# Patient Record
Sex: Female | Born: 1980
Health system: Southern US, Community
[De-identification: ages and names within clinical notes are randomized; demographics above are authoritative.]

## PROBLEM LIST (undated history)

## (undated) ENCOUNTER — Inpatient Hospital Stay (HOSPITAL_COMMUNITY): Payer: Self-pay

## (undated) DIAGNOSIS — J189 Pneumonia, unspecified organism: Secondary | ICD-10-CM

## (undated) DIAGNOSIS — Z5189 Encounter for other specified aftercare: Secondary | ICD-10-CM

## (undated) DIAGNOSIS — O24419 Gestational diabetes mellitus in pregnancy, unspecified control: Secondary | ICD-10-CM

## (undated) HISTORY — PX: OTHER SURGICAL HISTORY: SHX169

## (undated) HISTORY — PX: TONSILLECTOMY: SUR1361

## (undated) HISTORY — PX: TONSILECTOMY, ADENOIDECTOMY, BILATERAL MYRINGOTOMY AND TUBES: SHX2538

## (undated) HISTORY — PX: ABDOMINAL SURGERY: SHX537

---

## 2007-09-15 ENCOUNTER — Emergency Department (HOSPITAL_COMMUNITY): Admission: EM | Admit: 2007-09-15 | Discharge: 2007-09-15 | Payer: Self-pay | Admitting: Emergency Medicine

## 2007-10-24 ENCOUNTER — Emergency Department (HOSPITAL_COMMUNITY): Admission: EM | Admit: 2007-10-24 | Discharge: 2007-10-24 | Payer: Self-pay | Admitting: *Deleted

## 2009-05-01 ENCOUNTER — Other Ambulatory Visit: Admission: RE | Admit: 2009-05-01 | Discharge: 2009-05-01 | Payer: Self-pay | Admitting: Family Medicine

## 2011-06-27 LAB — WET PREP, GENITAL: Trich, Wet Prep: NONE SEEN

## 2011-06-27 LAB — CBC
HCT: 44.4
Platelets: 329
RBC: 4.73

## 2011-06-27 LAB — RPR: RPR Ser Ql: NONREACTIVE

## 2011-06-27 LAB — GC/CHLAMYDIA PROBE AMP, GENITAL
Chlamydia, DNA Probe: NEGATIVE
GC Probe Amp, Genital: NEGATIVE

## 2011-06-27 LAB — DIFFERENTIAL
Basophils Absolute: 0.1
Basophils Relative: 1
Eosinophils Absolute: 0.1
Monocytes Absolute: 0.6
Monocytes Relative: 5
Neutrophils Relative %: 71

## 2011-06-27 LAB — URINALYSIS, ROUTINE W REFLEX MICROSCOPIC
Bilirubin Urine: NEGATIVE
Ketones, ur: NEGATIVE
Specific Gravity, Urine: 1.019
Urobilinogen, UA: 0.2
pH: 7.5

## 2011-06-27 LAB — BASIC METABOLIC PANEL
Creatinine, Ser: 0.8
Sodium: 138

## 2011-06-27 LAB — POCT PREGNANCY, URINE: Operator id: 299071

## 2011-07-15 LAB — URINALYSIS, ROUTINE W REFLEX MICROSCOPIC
Bilirubin Urine: NEGATIVE
Hgb urine dipstick: NEGATIVE
Leukocytes, UA: NEGATIVE
Protein, ur: 100 — AB
pH: 5.5

## 2011-07-15 LAB — DIFFERENTIAL
Basophils Absolute: 0
Lymphocytes Relative: 2 — ABNORMAL LOW
Lymphs Abs: 0.4 — ABNORMAL LOW
Monocytes Absolute: 0.2
Monocytes Relative: 1 — ABNORMAL LOW
Neutrophils Relative %: 97 — ABNORMAL HIGH

## 2011-07-15 LAB — BASIC METABOLIC PANEL
BUN: 11
CO2: 21
Creatinine, Ser: 0.7
GFR calc Af Amer: >60
GFR calc non Af Amer: >60

## 2011-07-15 LAB — CBC
Hemoglobin: 15.1 — ABNORMAL HIGH
RBC: 4.67
RDW: 13.2

## 2011-10-08 NOTE — L&D Delivery Note (Signed)
Delivery Note  SVD viable female Apgars not given by NICU team over 2nd degree ML lac.  Placenta delivered spontaneously intact with 3VC. Repair with 2-0 Chromic with good support and hemostasis noted after 1% lidocaine infiltration and R/V exam confirms.  PH art was 7.24.  [place] cord blood was not done.  Mother and baby were doing well to NICU with team  EBL 300cc  Candice Camp, MD

## 2012-08-26 ENCOUNTER — Encounter (HOSPITAL_COMMUNITY): Payer: Self-pay | Admitting: *Deleted

## 2012-08-26 ENCOUNTER — Inpatient Hospital Stay (HOSPITAL_COMMUNITY)
Admission: AD | Admit: 2012-08-26 | Discharge: 2012-08-28 | DRG: 775 | Disposition: A | Discharge status: Home / Self Care | Payer: Medicaid Other | Source: Ambulatory Visit | Attending: Obstetrics and Gynecology | Admitting: Obstetrics and Gynecology

## 2012-08-26 LAB — RPR: RPR Ser Ql: NONREACTIVE

## 2012-08-26 LAB — CBC
Hemoglobin: 13.1 g/dL (ref 12.0–15.0)
MCHC: 33.5 g/dL (ref 30.0–36.0)
WBC: 33.3 10*3/uL — ABNORMAL HIGH (ref 4.0–10.5)

## 2012-08-26 MED ORDER — OXYCODONE-ACETAMINOPHEN 5-325 MG PO TABS: 1.0000 | ORAL_TABLET | ORAL | Status: DC | PRN

## 2012-08-26 MED ORDER — BUTORPHANOL TARTRATE 1 MG/ML IJ SOLN: 1.0000 mg | INTRAMUSCULAR | Status: DC | PRN

## 2012-08-26 MED ORDER — LACTATED RINGERS IV SOLN: 500.0000 mL | INTRAVENOUS | Status: DC | PRN

## 2012-08-26 MED ORDER — PRENATAL MULTIVITAMIN CH
1.0000 | ORAL_TABLET | Freq: Every day | ORAL | Status: DC
  Administered 2012-08-26: 1 via ORAL
  Filled 2012-08-26: qty 1

## 2012-08-26 MED ORDER — IBUPROFEN 600 MG PO TABS
600.0000 mg | ORAL_TABLET | Freq: Four times a day (QID) | ORAL | Status: DC | PRN
  Administered 2012-08-26: 600 mg via ORAL
  Filled 2012-08-26: qty 1

## 2012-08-26 MED ORDER — CITRIC ACID-SODIUM CITRATE 334-500 MG/5ML PO SOLN: 30.0000 mL | ORAL | Status: DC | PRN

## 2012-08-26 MED ORDER — LIDOCAINE HCL (PF) 1 % IJ SOLN
INTRAMUSCULAR | Status: AC
  Administered 2012-08-26: 30 mL
  Filled 2012-08-26: qty 30

## 2012-08-26 MED ORDER — MEASLES, MUMPS & RUBELLA VAC ~~LOC~~ INJ
0.5000 mL | INJECTION | Freq: Once | SUBCUTANEOUS | Status: DC
  Filled 2012-08-26: qty 0.5

## 2012-08-26 MED ORDER — TETANUS-DIPHTH-ACELL PERTUSSIS 5-2.5-18.5 LF-MCG/0.5 IM SUSP: 0.5000 mL | Freq: Once | INTRAMUSCULAR | Status: DC

## 2012-08-26 MED ORDER — IBUPROFEN 600 MG PO TABS
600.0000 mg | ORAL_TABLET | Freq: Four times a day (QID) | ORAL | Status: DC
  Administered 2012-08-26 – 2012-08-28 (×9): 600 mg via ORAL
  Filled 2012-08-26 (×9): qty 1

## 2012-08-26 MED ORDER — LANOLIN HYDROUS EX OINT: TOPICAL_OINTMENT | CUTANEOUS | Status: DC | PRN

## 2012-08-26 MED ORDER — SENNOSIDES-DOCUSATE SODIUM 8.6-50 MG PO TABS
2.0000 | ORAL_TABLET | Freq: Every day | ORAL | Status: DC
  Administered 2012-08-26 – 2012-08-27 (×2): 2 via ORAL

## 2012-08-26 MED ORDER — ONDANSETRON HCL 4 MG PO TABS: 4.0000 mg | ORAL_TABLET | ORAL | Status: DC | PRN

## 2012-08-26 MED ORDER — FLEET ENEMA 7-19 GM/118ML RE ENEM: 1.0000 | ENEMA | RECTAL | Status: DC | PRN

## 2012-08-26 MED ORDER — MEDROXYPROGESTERONE ACETATE 150 MG/ML IM SUSP: 150.0000 mg | INTRAMUSCULAR | Status: DC | PRN

## 2012-08-26 MED ORDER — SIMETHICONE 80 MG PO CHEW: 80.0000 mg | CHEWABLE_TABLET | ORAL | Status: DC | PRN

## 2012-08-26 MED ORDER — LIDOCAINE HCL (PF) 1 % IJ SOLN: 30.0000 mL | INTRAMUSCULAR | Status: DC | PRN

## 2012-08-26 MED ORDER — OXYTOCIN BOLUS FROM INFUSION: 500.0000 mL | INTRAVENOUS | Status: DC

## 2012-08-26 MED ORDER — OXYTOCIN 40 UNITS IN LACTATED RINGERS INFUSION - SIMPLE MED
INTRAVENOUS | Status: AC
  Administered 2012-08-26: 40 [IU]
  Filled 2012-08-26: qty 1000

## 2012-08-26 MED ORDER — ZOLPIDEM TARTRATE 5 MG PO TABS: 5.0000 mg | ORAL_TABLET | Freq: Every evening | ORAL | Status: DC | PRN

## 2012-08-26 MED ORDER — OXYTOCIN 40 UNITS IN LACTATED RINGERS INFUSION - SIMPLE MED: 62.5000 mL/h | INTRAVENOUS | Status: DC

## 2012-08-26 MED ORDER — ACETAMINOPHEN 325 MG PO TABS: 650.0000 mg | ORAL_TABLET | ORAL | Status: DC | PRN

## 2012-08-26 MED ORDER — WITCH HAZEL-GLYCERIN EX PADS: 1.0000 "application " | MEDICATED_PAD | CUTANEOUS | Status: DC | PRN

## 2012-08-26 MED ORDER — BENZOCAINE-MENTHOL 20-0.5 % EX AERO
1.0000 "application " | INHALATION_SPRAY | CUTANEOUS | Status: DC | PRN
  Administered 2012-08-26: 1 via TOPICAL
  Filled 2012-08-26: qty 56

## 2012-08-26 MED ORDER — ONDANSETRON HCL 4 MG/2ML IJ SOLN: 4.0000 mg | INTRAMUSCULAR | Status: DC | PRN

## 2012-08-26 MED ORDER — ONDANSETRON HCL 4 MG/2ML IJ SOLN: 4.0000 mg | Freq: Four times a day (QID) | INTRAMUSCULAR | Status: DC | PRN

## 2012-08-26 MED ORDER — LACTATED RINGERS IV SOLN: INTRAVENOUS | Status: DC

## 2012-08-26 MED ORDER — DIPHENHYDRAMINE HCL 25 MG PO CAPS: 25.0000 mg | ORAL_CAPSULE | Freq: Four times a day (QID) | ORAL | Status: DC | PRN

## 2012-08-26 MED ORDER — DIBUCAINE 1 % RE OINT: 1.0000 "application " | TOPICAL_OINTMENT | RECTAL | Status: DC | PRN

## 2012-08-26 NOTE — H&P (Addendum)
Adriana Jarvis is a 31 y.o. female presenting for evaluation of contractions and vaginal spotting.  Pregnancy uncomplicated [place].  Was seen in the office 2 days ago and cervix was closed and long.  Had painful ctxs this am and presented to triage. History OB History    Grav Para Term Preterm Abortions TAB SAB Ect Mult Living   1 1  1      1      No past medical history on file. No past surgical history on file. Family History: family history is not on file. Social History:  does not have a smoking history on file. She does not have any smokeless tobacco history on file. Her alcohol and drug histories not on file.   Prenatal Transfer Tool  Maternal Diabetes: No Genetic Screening: Normal Maternal Ultrasounds/Referrals: Normal Fetal Ultrasounds or other Referrals:  None Maternal Substance Abuse:  No Significant Maternal Medications:  None Significant Maternal Lab Results:  None Other Comments:  None  ROS  Dilation: 10 Effacement (%): 100 Station:  (BBOW) Exam by:: L. Munford RN Blood pressure 142/89, pulse 99, resp. rate 22, height 5\' 3"  (1.6 m), weight 75.751 kg (167 lb), SpO2 99.00%, unknown if currently breastfeeding. Exam Physical Exam  Prenatal labs: ABO, Rh:   Antibody:   Rubella:   RPR:    HBsAg:    HIV:    GBS:     Assessment/Plan: IUP at 31 3/7 weeks Now complete.  Anticipate SVD.  NICU alerted. Betamethasone and Magnesium neuorprophylaxis not given because of immanent delivery   Damiyah Ditmars C 08/26/2012, 6:54 AM

## 2012-08-26 NOTE — MAU Note (Signed)
Vaginal bleeding since Monday. Went to the office and was closed and thick. Heavier bleeding and abdominal pain started around 1am this morning.

## 2012-08-27 ENCOUNTER — Encounter (HOSPITAL_COMMUNITY): Payer: Self-pay | Admitting: *Deleted

## 2012-08-27 LAB — CBC
HCT: 35 % — ABNORMAL LOW (ref 36.0–46.0)
Platelets: 233 10*3/uL (ref 150–400)
RDW: 14.2 % (ref 11.5–15.5)
WBC: 23.1 10*3/uL — ABNORMAL HIGH (ref 4.0–10.5)

## 2012-08-27 NOTE — Progress Notes (Signed)
Post Partum Day 1 Subjective: no complaints, up ad lib, voiding, tolerating PO, + flatus and baby stable in NICU off O2  Objective: Blood pressure 120/77, pulse 77, temperature 97.3 F (36.3 C), temperature source Oral, resp. rate 18, height 5\' 3"  (1.6 m), weight 75.751 kg (167 lb), SpO2 100.00%, unknown if currently breastfeeding.  Physical Exam:  General: alert and cooperative Lochia: appropriate Uterine Fundus: firm Incision: perineum intact DVT Evaluation: No evidence of DVT seen on physical exam. No significant calf/ankle edema.   Basename 08/27/12 0550 08/26/12 0620  HGB 11.4* 13.1  HCT 35.0* 39.1    Assessment/Plan: Plan for discharge tomorrow   LOS: 1 day   Adriana Jarvis 08/27/2012, 8:08 AM

## 2012-08-27 NOTE — Progress Notes (Signed)
UR done. 

## 2012-08-28 NOTE — Discharge Summary (Signed)
Obstetric Discharge Summary Reason for Admission: onset of labor Prenatal Procedures: none Intrapartum Procedures: spontaneous vaginal delivery Postpartum Procedures: none Complications-Operative and Postpartum: first degree perineal laceration Hemoglobin  Date Value Range Status  08/27/2012 11.4* 12.0 - 15.0 g/dL Final     HCT  Date Value Range Status  08/27/2012 35.0* 36.0 - 46.0 % Final    Physical Exam:  General: alert Lochia: appropriate Uterine Fundus: firm Incision: na DVT Evaluation: No evidence of DVT seen on physical exam.  Discharge Diagnoses: preterm vaginal delivery at 31.3  Discharge Information: Date: 08/28/2012 Activity: pelvic rest Diet: routine Medications: PNV and Ibuprofen Condition: stable Instructions: refer to practice specific booklet Discharge to: home   Newborn Data: Live born female  Birth Weight: 4 lb 4.1 oz (1930 g) APGAR: 8, 9  Home with NICU.  Tima Curet S 08/28/2012, 8:19 AM

## 2012-08-28 NOTE — Progress Notes (Signed)
Post Partum Day two Subjective: no complaints  Objective: Blood pressure 110/47, pulse 76, temperature 97.9 F (36.6 C), temperature source Oral, resp. rate 18, height 5\' 3"  (1.6 m), weight 75.751 kg (167 lb), SpO2 98.00%, unknown if currently breastfeeding.  Physical Exam:  General: alert Lochia: appropriate Uterine Fundus: firm Incision: na DVT Evaluation: No evidence of DVT seen on physical exam.   Basename 08/27/12 0550 08/26/12 0620  HGB 11.4* 13.1  HCT 35.0* 39.1    Assessment/Plan: Discharge home   LOS: 2 days   Jadwiga Faidley S 08/28/2012, 8:18 AM

## 2012-09-23 ENCOUNTER — Other Ambulatory Visit: Payer: Self-pay | Admitting: Obstetrics and Gynecology

## 2013-10-11 ENCOUNTER — Other Ambulatory Visit: Payer: Self-pay | Admitting: Obstetrics and Gynecology

## 2013-10-12 ENCOUNTER — Other Ambulatory Visit: Payer: Self-pay | Admitting: Obstetrics and Gynecology

## 2013-10-12 DIAGNOSIS — N6325 Unspecified lump in the left breast, overlapping quadrants: Secondary | ICD-10-CM

## 2013-10-12 DIAGNOSIS — N6320 Unspecified lump in the left breast, unspecified quadrant: Principal | ICD-10-CM

## 2013-10-15 ENCOUNTER — Ambulatory Visit: Payer: Medicaid Other

## 2013-10-15 ENCOUNTER — Other Ambulatory Visit: Payer: Medicaid Other

## 2013-10-21 ENCOUNTER — Ambulatory Visit
Admission: RE | Admit: 2013-10-21 | Discharge: 2013-10-21 | Disposition: A | Discharge status: Home / Self Care | Payer: Medicaid Other | Source: Ambulatory Visit | Attending: Obstetrics and Gynecology | Admitting: Obstetrics and Gynecology

## 2013-10-21 DIAGNOSIS — N6320 Unspecified lump in the left breast, unspecified quadrant: Principal | ICD-10-CM

## 2013-10-21 DIAGNOSIS — N6325 Unspecified lump in the left breast, overlapping quadrants: Secondary | ICD-10-CM

## 2014-08-08 ENCOUNTER — Encounter (HOSPITAL_COMMUNITY): Payer: Self-pay | Admitting: *Deleted

## 2015-09-18 LAB — OB RESULTS CONSOLE HIV ANTIBODY (ROUTINE TESTING): HIV: NONREACTIVE

## 2015-09-18 LAB — OB RESULTS CONSOLE GC/CHLAMYDIA
CHLAMYDIA, DNA PROBE: NEGATIVE
GC PROBE AMP, GENITAL: NEGATIVE

## 2015-09-18 LAB — OB RESULTS CONSOLE RUBELLA ANTIBODY, IGM: RUBELLA: IMMUNE

## 2015-09-18 LAB — OB RESULTS CONSOLE HEPATITIS B SURFACE ANTIGEN: HEP B S AG: NEGATIVE

## 2015-10-08 NOTE — L&D Delivery Note (Signed)
Delivery Note At 10:43 PM a viable female "Adriana Jarvis" was delivered via Vaginal, Spontaneous Delivery (Presentation: OA restituting to Right Occiput Anterior).  APGARS: 8, 10; weight 7 lb 9.9 oz (3456 g).   Placenta status: Intact, Spontaneous [place]. Cord: 3 vessels with the following complications: None.  Cord pH: NA  Anesthesia: Epidural Episiotomy: None Lacerations: 2nd degree;Perineal Suture Repair: 3.0 chromic CT and SH Est. Blood Loss (mL): 350  Mom to postpartum.  Baby to Couplet care / Skin to Skin.  Mom plans to breastfeed.  Couple plans vasectomy for contraception.  Desires inpatient circumcision.  Sherre ScarletWILLIAMS, Adriana Abernethy 04/22/2016, 12:38 AM

## 2016-02-14 LAB — OB RESULTS CONSOLE RPR: RPR: NONREACTIVE

## 2016-03-11 ENCOUNTER — Encounter: Payer: 59 | Attending: Obstetrics and Gynecology | Admitting: Skilled Nursing Facility1

## 2016-03-11 VITALS — Ht 64.0 in

## 2016-03-11 DIAGNOSIS — O24419 Gestational diabetes mellitus in pregnancy, unspecified control: Secondary | ICD-10-CM | POA: Diagnosis present

## 2016-03-11 DIAGNOSIS — O24410 Gestational diabetes mellitus in pregnancy, diet controlled: Secondary | ICD-10-CM

## 2016-03-12 ENCOUNTER — Encounter: Payer: Self-pay | Admitting: Skilled Nursing Facility1

## 2016-03-12 NOTE — Progress Notes (Signed)
  Patient was seen on 03/12/2016 for Gestational Diabetes self-management class at the [facility]. The following learning objectives were met by the patient during this course:   States the definition of Gestational Diabetes  States why dietary management is important in controlling blood glucose  Describes the effects each nutrient has on blood glucose levels  Demonstrates ability to create a balanced meal plan  Demonstrates carbohydrate counting   States when to check blood glucose levels involving a total of 4 separate occurences in a day  Demonstrates proper blood glucose monitoring techniques  States the effect of stress and exercise on blood glucose levels  States the importance of limiting caffeine and abstaining from alcohol and smoking  Demonstrates the knowledge the glucometer provided in class may not be covered by their insurance and to call their insurance provider immediately after class to know which glucometer their insurance provider does cover as well as calling their physician the next day for a prescription to the glucometer their insurance does cover (if the one provided is not) as well as the lancets and strips for that meter.  Blood glucose monitor given: contour next one Lot # [id number] Exp: 12/04/2016 Blood glucose reading: Not taken due to unavailable strips  Patient instructed to monitor glucose levels: FBS: 60 - <90 1 hour: <140 2 hour: <120  *Patient received handouts:  Nutrition Diabetes and Pregnancy  Carbohydrate Counting List  Patient will be seen for follow-up as needed.

## 2016-03-20 ENCOUNTER — Inpatient Hospital Stay (HOSPITAL_COMMUNITY)
Admission: AD | Admit: 2016-03-20 | Discharge: 2016-03-20 | Disposition: A | Discharge status: Home / Self Care | Payer: 59 | Source: Ambulatory Visit | Attending: Obstetrics and Gynecology | Admitting: Obstetrics and Gynecology

## 2016-03-20 ENCOUNTER — Encounter (HOSPITAL_COMMUNITY): Payer: Self-pay | Admitting: *Deleted

## 2016-03-20 DIAGNOSIS — Z3A32 32 weeks gestation of pregnancy: Secondary | ICD-10-CM | POA: Insufficient documentation

## 2016-03-20 DIAGNOSIS — O4703 False labor before 37 completed weeks of gestation, third trimester: Secondary | ICD-10-CM | POA: Insufficient documentation

## 2016-03-20 HISTORY — DX: Pneumonia, unspecified organism: J18.9

## 2016-03-20 HISTORY — DX: Gestational diabetes mellitus in pregnancy, unspecified control: O24.419

## 2016-03-20 HISTORY — DX: Encounter for other specified aftercare: Z51.89

## 2016-03-20 LAB — URINALYSIS, ROUTINE W REFLEX MICROSCOPIC
BILIRUBIN URINE: NEGATIVE
Glucose, UA: NEGATIVE mg/dL
Hgb urine dipstick: NEGATIVE
Ketones, ur: 15 mg/dL — AB
NITRITE: NEGATIVE
Protein, ur: NEGATIVE mg/dL
pH: 6 (ref 5.0–8.0)

## 2016-03-20 LAB — FETAL FIBRONECTIN: FETAL FIBRONECTIN: NEGATIVE

## 2016-03-20 LAB — URINE MICROSCOPIC-ADD ON

## 2016-03-20 MED ORDER — NIFEDIPINE 10 MG PO CAPS: 10.0000 mg | ORAL_CAPSULE | Freq: Four times a day (QID) | ORAL | Status: DC | PRN

## 2016-03-20 MED ORDER — LACTATED RINGERS IV BOLUS (SEPSIS)
1000.0000 mL | Freq: Once | INTRAVENOUS | Status: AC
  Administered 2016-03-20: 1000 mL via INTRAVENOUS

## 2016-03-20 MED ORDER — NIFEDIPINE 10 MG PO CAPS
10.0000 mg | ORAL_CAPSULE | ORAL | Status: AC | PRN
  Administered 2016-03-20 (×3): 10 mg via ORAL
  Filled 2016-03-20 (×3): qty 1

## 2016-03-20 NOTE — Discharge Instructions (Signed)

## 2016-03-20 NOTE — MAU Provider Note (Signed)
History     CSN: [id number]  Arrival date and time: 03/20/16 1206   First Provider Initiated Contact with Patient 03/20/16 1333       Chief Complaint  Patient presents with  . preterm contractions    HPI Adriana Jarvis is a 35 y.o. G2P0101 at 3740w5d who presents with contractions. PMH significant for PTD at 31 wks with previous pregnancy. Pt gets weekly 17p injections -- due today. Reports contractions since last night -- increased in frequency this morning, every 6-8 minutes. Rates pain 2/10. Denies LOF or vaginal bleeding. Denies recent intercourse. Positive fetal movement.   OB History    Gravida Para Term Preterm AB TAB SAB Ectopic Multiple Living   2 1  1      1       Past Medical History  Diagnosis Date  . Blood transfusion without reported diagnosis   . Gestational diabetes   . Pneumonia   . Preterm labor     Past Surgical History  Procedure Laterality Date  . Tonsilectomy, adenoidectomy, bilateral myringotomy and tubes    . Tonsillectomy      Family History  Problem Relation Age of Onset  . Cancer Other   . Hyperlipidemia Other     Social History  Substance Use Topics  . Smoking status: Former Smoker    Types: Cigarettes    Quit date: 06/08/2015  . Smokeless tobacco: None  . Alcohol Use: No    Allergies:  Allergies  Allergen Reactions  . Phenergan [Promethazine Hcl] Anaphylaxis    Tongue swelling, throat closes  . Amoxicillin Hives    Has patient had a PCN reaction causing immediate rash, facial/tongue/throat swelling, SOB or lightheadedness with hypotension: Yes Has patient had a PCN reaction causing severe rash involving mucus membranes or skin necrosis: No Has patient had a PCN reaction that required hospitalization No Has patient had a PCN reaction occurring within the last 10 years: No If all of the above answers are "NO", then may proceed with Cephalosporin use.     Prescriptions prior to admission  Medication Sig Dispense Refill Last  Dose  . hydroxyprogesterone caproate (MAKENA) 250 mg/mL OIL injection Inject 250 mg into the muscle once a week.   03/13/2016  . pantoprazole (PROTONIX) 20 MG tablet Take 20 mg by mouth daily.    03/20/2016 at Unknown time  . Prenatal Vit-Fe Fumarate-FA (PRENATAL MULTIVITAMIN) TABS tablet Take 1 tablet by mouth daily at 12 noon.   03/20/2016 at Unknown time  . acetaminophen (TYLENOL) 500 MG tablet Take 500 mg by mouth daily as needed. For pain   08/25/2012 at Unknown    Review of Systems  Constitutional: Negative.   Gastrointestinal: Positive for abdominal pain. Negative for nausea, vomiting, diarrhea and constipation.  Genitourinary: Negative.    Physical Exam   Blood pressure 122/82, pulse 86, temperature 98.2 F (36.8 C), temperature source Oral, resp. rate 18, height 5\' 4"  (1.626 m), weight 191 lb (86.637 kg), SpO2 99 %.  Physical Exam  Nursing note and vitals reviewed. Constitutional: She is oriented to person, place, and time. She appears well-developed and well-nourished. No distress.  HENT:  Head: Normocephalic and atraumatic.  Eyes: Conjunctivae are normal. Right eye exhibits no discharge. Left eye exhibits no discharge. No scleral icterus.  Neck: Normal range of motion.  Cardiovascular: Normal rate, regular rhythm and normal heart sounds.   No murmur heard. Respiratory: Effort normal and breath sounds normal. No respiratory distress. She has no wheezes.  GI: Soft.  There is no tenderness.  Neurological: She is alert and oriented to person, place, and time.  Skin: Skin is warm and dry. She is not diaphoretic.  Psychiatric: She has a normal mood and affect. Her behavior is normal. Judgment and thought content normal.   Dilation: 1.5 Effacement (%): Thick Cervical Position: Posterior Station: Ballotable Exam by:: Judeth Horn NP  Fetal Tracing:  Baseline: 130  Variability: moderate Accelerations: 15x15 Decelerations: none  Toco: initially 3-8 mins, palpate mild.  Minimal UI after procardia   MAU Course  Procedures Results for orders placed or performed during the hospital encounter of 03/20/16 (from the past 24 hour(s))  Urinalysis, Routine w reflex microscopic (not at [facility])     Status: Abnormal   Collection Time: 03/20/16  1:00 PM  Result Value Ref Range   Color, Urine YELLOW YELLOW   APPearance CLEAR CLEAR   Specific Gravity, Urine <1.005 (L) 1.005 - 1.030   pH 6.0 5.0 - 8.0   Glucose, UA NEGATIVE NEGATIVE mg/dL   Hgb urine dipstick NEGATIVE NEGATIVE   Bilirubin Urine NEGATIVE NEGATIVE   Ketones, ur 15 (A) NEGATIVE mg/dL   Protein, ur NEGATIVE NEGATIVE mg/dL   Nitrite NEGATIVE NEGATIVE   Leukocytes, UA SMALL (A) NEGATIVE  Urine microscopic-add on     Status: Abnormal   Collection Time: 03/20/16  1:00 PM  Result Value Ref Range   Squamous Epithelial / LPF 0-5 (A) NONE SEEN   WBC, UA 0-5 0 - 5 WBC/hpf   RBC / HPF 0-5 0 - 5 RBC/hpf   Bacteria, UA FEW (A) NONE SEEN  Fetal fibronectin     Status: None   Collection Time: 03/20/16  1:45 PM  Result Value Ref Range   Fetal Fibronectin NEGATIVE NEGATIVE    MDM Category 1 tracing Cervix 1.5/thick/posterior -- FFN sent Procardia 10 mg Q20 mins x 3 doses FFN negative Pt reports improvement in symptoms & ctx have decreased Cervix unchanged S/w Dr. Normand Sloop -- ok to discharge home with rx for procardia prn Pt has f/u in the office on Friday  Assessment and Plan  A: 1. Preterm uterine contractions in third trimester, antepartum     P: Discharge home Rx procardia prn >5 ctx per hour Preterm labor precautions Keep f/u with ob on Friday  Judeth Horn 03/20/2016, 1:33 PM

## 2016-03-20 NOTE — MAU Note (Signed)
Braxton hicks started last night. 6-8 min apart. Was able to sleep.  Still having them, now more than 4/hr. Has a hx of PTL

## 2016-03-20 NOTE — MAU Note (Signed)
Pt received to MAU room 6 c/o contractions since last night around 8pm that have developed in severity and frequency. Patient rates pain 2/10. Denies leaking fluid or vaginal bleeding. Will continue to monitor.Nechelle Petrizzo L Nahzir Pohle

## 2016-03-22 ENCOUNTER — Observation Stay (HOSPITAL_COMMUNITY)
Admission: AD | Admit: 2016-03-22 | Discharge: 2016-03-24 | Disposition: A | Discharge status: Home / Self Care | Payer: 59 | Source: Ambulatory Visit | Attending: Obstetrics and Gynecology | Admitting: Obstetrics and Gynecology

## 2016-03-22 ENCOUNTER — Encounter (HOSPITAL_COMMUNITY): Payer: Self-pay | Admitting: *Deleted

## 2016-03-22 DIAGNOSIS — Z87891 Personal history of nicotine dependence: Secondary | ICD-10-CM | POA: Diagnosis not present

## 2016-03-22 DIAGNOSIS — O479 False labor, unspecified: Secondary | ICD-10-CM | POA: Diagnosis present

## 2016-03-22 DIAGNOSIS — O4703 False labor before 37 completed weeks of gestation, third trimester: Secondary | ICD-10-CM

## 2016-03-22 DIAGNOSIS — O4700 False labor before 37 completed weeks of gestation, unspecified trimester: Secondary | ICD-10-CM | POA: Diagnosis present

## 2016-03-22 LAB — ABO/RH: ABO/RH(D): A POS

## 2016-03-22 LAB — URINALYSIS, ROUTINE W REFLEX MICROSCOPIC
BILIRUBIN URINE: NEGATIVE
Glucose, UA: NEGATIVE mg/dL
KETONES UR: NEGATIVE mg/dL
NITRITE: NEGATIVE
PH: 6 (ref 5.0–8.0)
PROTEIN: NEGATIVE mg/dL
Specific Gravity, Urine: 1.015 (ref 1.005–1.030)

## 2016-03-22 LAB — URINE MICROSCOPIC-ADD ON

## 2016-03-22 LAB — GLUCOSE, CAPILLARY
Glucose-Capillary: 127 mg/dL — ABNORMAL HIGH (ref 65–99)
Glucose-Capillary: 117 mg/dL — ABNORMAL HIGH (ref 65–99)

## 2016-03-22 LAB — CBC
HEMATOCRIT: 37.8 % (ref 36.0–46.0)
Hemoglobin: 12.9 g/dL (ref 12.0–15.0)
MCH: 31.2 pg (ref 26.0–34.0)
MCHC: 34.1 g/dL (ref 30.0–36.0)
MCV: 91.5 fL (ref 78.0–100.0)
PLATELETS: 224 10*3/uL (ref 150–400)
RBC: 4.13 MIL/uL (ref 3.87–5.11)
RDW: 13.9 % (ref 11.5–15.5)
WBC: 15.2 10*3/uL — AB (ref 4.0–10.5)

## 2016-03-22 LAB — TYPE AND SCREEN
ABO/RH(D): A POS
Antibody Screen: NEGATIVE

## 2016-03-22 LAB — OB RESULTS CONSOLE GBS: GBS: POSITIVE

## 2016-03-22 MED ORDER — CALCIUM CARBONATE ANTACID 500 MG PO CHEW
2.0000 | CHEWABLE_TABLET | ORAL | Status: DC | PRN
  Administered 2016-03-22 – 2016-03-23 (×5): 400 mg via ORAL
  Filled 2016-03-22 (×6): qty 2

## 2016-03-22 MED ORDER — NIFEDIPINE 10 MG PO CAPS
10.0000 mg | ORAL_CAPSULE | ORAL | Status: AC | PRN
  Administered 2016-03-22 (×3): 10 mg via ORAL
  Filled 2016-03-22 (×3): qty 1

## 2016-03-22 MED ORDER — PRENATAL MULTIVITAMIN CH
1.0000 | ORAL_TABLET | Freq: Every day | ORAL | Status: DC
  Administered 2016-03-23: 1 via ORAL
  Filled 2016-03-22 (×2): qty 1

## 2016-03-22 MED ORDER — LACTATED RINGERS IV SOLN
INTRAVENOUS | Status: DC
  Administered 2016-03-22 (×2): via INTRAVENOUS

## 2016-03-22 MED ORDER — ACETAMINOPHEN 325 MG PO TABS
650.0000 mg | ORAL_TABLET | ORAL | Status: DC | PRN
  Administered 2016-03-22: 650 mg via ORAL
  Filled 2016-03-22: qty 2

## 2016-03-22 MED ORDER — BETAMETHASONE SOD PHOS & ACET 6 (3-3) MG/ML IJ SUSP
12.0000 mg | Freq: Once | INTRAMUSCULAR | Status: AC
  Administered 2016-03-22: 12 mg via INTRAMUSCULAR
  Filled 2016-03-22: qty 2

## 2016-03-22 MED ORDER — MAGNESIUM SULFATE BOLUS VIA INFUSION
4.0000 g | Freq: Once | INTRAVENOUS | Status: AC
  Administered 2016-03-22: 4 g via INTRAVENOUS
  Filled 2016-03-22: qty 500

## 2016-03-22 MED ORDER — BETAMETHASONE SOD PHOS & ACET 6 (3-3) MG/ML IJ SUSP
12.0000 mg | Freq: Once | INTRAMUSCULAR | Status: AC
  Administered 2016-03-23: 12 mg via INTRAMUSCULAR
  Filled 2016-03-22 (×2): qty 2

## 2016-03-22 MED ORDER — MAGNESIUM SULFATE 50 % IJ SOLN
2.0000 g/h | INTRAVENOUS | Status: DC
  Administered 2016-03-22: 2 g/h via INTRAVENOUS
  Filled 2016-03-22: qty 80

## 2016-03-22 MED ORDER — DOCUSATE SODIUM 100 MG PO CAPS
100.0000 mg | ORAL_CAPSULE | Freq: Every day | ORAL | Status: DC
  Administered 2016-03-22 – 2016-03-23 (×2): 100 mg via ORAL
  Filled 2016-03-22 (×2): qty 1

## 2016-03-22 MED ORDER — ZOLPIDEM TARTRATE 5 MG PO TABS: 5.0000 mg | ORAL_TABLET | Freq: Every evening | ORAL | Status: DC | PRN

## 2016-03-22 NOTE — MAU Note (Signed)
Pt reported they obtained another FFN today.  Awaiting results.  Pt also had ROM ruled out in office.  Pt sent for observation and for BMZ.  Good fetal movement.

## 2016-03-22 NOTE — MAU Provider Note (Signed)
History     CSN: [id number]  Arrival date and time: 03/22/16 1059   First Provider Initiated Contact with Patient 03/22/16 1229       Chief Complaint  Patient presents with  . Contractions   HPI  Janeece RiggersJamie L Hasty is a 35 y.o. [id number] at 6482w6d who presents contractions. Was seen in [place] on Wednesday for same complaint; had negative FFN & discharged home on procardia prn. Went to office this morning for ROV. Last took procardia yesterday. States contractions started again this morning while she was in the office. Feels ctx every 5 minutes and rates them 5/10 on pain scale. Cervix was checked in office & found to be 2 cm dilated. Denies n/v/d, dysuria, recent intercourse, or LOF. Some brown spotting she attributes to her cervical exams. Positive fetal movement.   OB History    Gravida Para Term Preterm AB TAB SAB Ectopic Multiple Living   3 1  1 1  1   1       Past Medical History  Diagnosis Date  . Blood transfusion without reported diagnosis   . Gestational diabetes   . Pneumonia   . Preterm labor     Past Surgical History  Procedure Laterality Date  . Tonsilectomy, adenoidectomy, bilateral myringotomy and tubes    . Tonsillectomy    . Abdominal surgery      exploratory  . Thumb ligament      Rt thumb    Family History  Problem Relation Age of Onset  . Cancer Other   . Hyperlipidemia Other     Social History  Substance Use Topics  . Smoking status: Former Smoker    Types: Cigarettes    Quit date: 06/08/2015  . Smokeless tobacco: None  . Alcohol Use: No    Allergies:  Allergies  Allergen Reactions  . Phenergan [Promethazine Hcl] Anaphylaxis    Tongue swelling, throat closes  . Amoxicillin Hives    Has patient had a PCN reaction causing immediate rash, facial/tongue/throat swelling, SOB or lightheadedness with hypotension: Yes Has patient had a PCN reaction causing severe rash involving mucus membranes or skin necrosis: No Has patient had a PCN reaction that  required hospitalization No Has patient had a PCN reaction occurring within the last 10 years: No If all of the above answers are "NO", then may proceed with Cephalosporin use.     Prescriptions prior to admission  Medication Sig Dispense Refill Last Dose  . NIFEdipine (PROCARDIA) 10 MG capsule Take 1 capsule (10 mg total) by mouth every 6 (six) hours as needed (more than 5 contractions per hour). 20 capsule 0 03/21/2016 at Unknown time  . acetaminophen (TYLENOL) 500 MG tablet Take 500 mg by mouth daily as needed. For pain   08/25/2012 at Unknown  . hydroxyprogesterone caproate (MAKENA) 250 mg/mL OIL injection Inject 250 mg into the muscle once a week.   03/13/2016  . pantoprazole (PROTONIX) 20 MG tablet Take 20 mg by mouth daily.    03/20/2016 at Unknown time  . Prenatal Vit-Fe Fumarate-FA (PRENATAL MULTIVITAMIN) TABS tablet Take 1 tablet by mouth daily at 12 noon.   03/20/2016 at Unknown time    Review of Systems  Constitutional: Negative.   Gastrointestinal: Positive for abdominal pain. Negative for nausea, vomiting, diarrhea and constipation.  Genitourinary: Negative for dysuria.       + brown spotting   Physical Exam   Blood pressure 113/70, pulse 102, temperature 97.7 F (36.5 C), temperature source Oral, resp. rate 18.  Physical Exam  Nursing note and vitals reviewed. Constitutional: She is oriented to person, place, and time. She appears well-developed and well-nourished. No distress.  HENT:  Head: Normocephalic and atraumatic.  Eyes: Conjunctivae are normal. Right eye exhibits no discharge. Left eye exhibits no discharge. No scleral icterus.  Neck: Normal range of motion.  Cardiovascular: Normal rate.   Respiratory: Effort normal. No respiratory distress.  GI: Soft. There is no tenderness.  Neurological: She is alert and oriented to person, place, and time.  Skin: Skin is warm and dry. She is not diaphoretic.  Psychiatric: She has a normal mood and affect. Her behavior is  normal. Judgment and thought content normal.   Dilation: 2.5 Effacement (%): Thick Cervical Position: Middle Station: -3 Exam by:: Estanislado Spire, NP  Fetal Tracing:  Baseline: 135 Variability: moderate Accelerations: 15x15 Decelerations: none  Toco: 3-6 mins  MAU Course  Procedures Results for orders placed or performed during the hospital encounter of 03/22/16 (from the past 24 hour(s))  OB RESULT CONSOLE Group B Strep     Status: None   Collection Time: 03/22/16 12:00 AM  Result Value Ref Range   GBS Positive   Urinalysis, Routine w reflex microscopic (not at [facility])     Status: Abnormal   Collection Time: 03/22/16 11:22 AM  Result Value Ref Range   Color, Urine YELLOW YELLOW   APPearance CLEAR CLEAR   Specific Gravity, Urine 1.015 1.005 - 1.030   pH 6.0 5.0 - 8.0   Glucose, UA NEGATIVE NEGATIVE mg/dL   Hgb urine dipstick MODERATE (A) NEGATIVE   Bilirubin Urine NEGATIVE NEGATIVE   Ketones, ur NEGATIVE NEGATIVE mg/dL   Protein, ur NEGATIVE NEGATIVE mg/dL   Nitrite NEGATIVE NEGATIVE   Leukocytes, UA TRACE (A) NEGATIVE  Urine microscopic-add on     Status: Abnormal   Collection Time: 03/22/16 11:22 AM  Result Value Ref Range   Squamous Epithelial / LPF 0-5 (A) NONE SEEN   WBC, UA 0-5 0 - 5 WBC/hpf   RBC / HPF 0-5 0 - 5 RBC/hpf   Bacteria, UA FEW (A) NONE SEEN   Urine-Other MUCOUS PRESENT     MDM Reactive tracing Ctx every 3-6 minutes First dose of BMZ given PO hydration & procardia 10 mg Q20 mins x 3 doses Ctx improved after meds SVE changed from Wednesday's exam S/w Dr. Su Hilt -- admit to antenatal for observation -- monitoring & second dose of BMZ -- Dr. Les Pou will call NICU  Assessment and Plan  A: 1. Preterm contractions, third trimester     P: Admit to antenatal for observation  Judeth Horn 03/22/2016, 11:43 AM

## 2016-03-22 NOTE — Progress Notes (Signed)
Orders for Magnesium given at this time

## 2016-03-22 NOTE — H&P (Signed)
HPI  Adriana Jarvis is a 35 y.o. [phone] at [redacted]w[redacted]d who presents contractions. Was seen in [place] on Wednesday for same complaint; had negative FFN & discharged home on procardia prn. Went to office this morning for ROV. Last took procardia yesterday. States contractions started again this morning while she was in the office. Feels ctx every 5 minutes and rates them 5/10 on pain scale. Cervix was checked in office & found to be 2 cm dilated. Denies n/v/d, dysuria, recent intercourse, or LOF. Some brown spotting she attributes to her cervical exams. Positive fetal movement.   OB History    Gravida Para Term Preterm AB TAB SAB Ectopic Multiple Living   Past Medical History  Diagnosis Date  . Blood transfusion without reported diagnosis   . Gestational diabetes   . Pneumonia   . Preterm labor     Past Surgical History  Procedure Laterality Date  . Tonsilectomy, adenoidectomy, bilateral myringotomy and tubes    . Tonsillectomy    . Abdominal surgery      exploratory  . Thumb ligament      Rt thumb    Family History  Problem Relation Age of Onset  . Cancer Other   . Hyperlipidemia Other     Social History  Substance Use Topics  . Smoking status: Former Smoker    Types: Cigarettes    Quit date: 06/08/2015  . Smokeless tobacco: None  . Alcohol Use: No    Allergies:  Allergies  Allergen Reactions  . Phenergan [Promethazine Hcl] Anaphylaxis    Tongue swelling, throat closes  . Amoxicillin Hives    Has patient had a PCN reaction causing immediate rash, facial/tongue/throat swelling, SOB or lightheadedness with hypotension: Yes Has patient had a PCN reaction causing severe rash involving mucus membranes or skin necrosis: No Has patient had a PCN reaction that required hospitalization No Has patient had a PCN reaction occurring within the  last 10 years: No If all of the above answers are "NO", then may proceed with Cephalosporin use.     Prescriptions prior to admission  Medication Sig Dispense Refill Last Dose  . NIFEdipine (PROCARDIA) 10 MG capsule Take 1 capsule (10 mg total) by mouth every 6 (six) hours as needed (more than 5 contractions per hour). 20 capsule 0 03/21/2016 at Unknown time  . acetaminophen (TYLENOL) 500 MG tablet Take 500 mg by mouth daily as needed. For pain   08/25/2012 at Unknown  . hydroxyprogesterone caproate (MAKENA) 250 mg/mL OIL injection Inject 250 mg into the muscle once a week.   03/13/2016  . pantoprazole (PROTONIX) 20 MG tablet Take 20 mg by mouth daily.    03/20/2016 at Unknown time  . Prenatal Vit-Fe Fumarate-FA (PRENATAL MULTIVITAMIN) TABS tablet Take 1 tablet by mouth daily at 12 noon.   03/20/2016 at Unknown time    Review of Systems  Constitutional: Negative.  Gastrointestinal: Positive for abdominal pain. Negative for nausea, vomiting, diarrhea and constipation.  Genitourinary: Negative for dysuria.   + brown spotting   Physical Exam   Blood pressure 113/70, pulse 102, temperature 97.7 F (36.5 C), temperature source Oral, resp. rate 18.  Physical Exam  Nursing note and vitals reviewed. Constitutional: She is oriented to person, place, and time. She appears well-developed and well-nourished. No distress.  HENT:  Head: Normocephalic and atraumatic.  Eyes: Conjunctivae are normal. Right eye exhibits no discharge. Left eye exhibits no  discharge. No scleral icterus.  Neck: Normal range of motion.  Cardiovascular: Normal rate.  Respiratory: Effort normal. No respiratory distress.  GI: Soft. There is no tenderness.  Neurological: She is alert and oriented to person, place, and time.  Skin: Skin is warm and dry. She is not diaphoretic.  Psychiatric: She has a normal mood and affect. Her behavior is normal. Judgment and thought content  normal.   Dilation: 2.5 Effacement (%): Thick Cervical Position: Middle Station: -3 Exam by:: Estanislado SpireE. Lawrence, NP  Fetal Tracing:  Baseline: 135 Variability: moderate Accelerations: 15x15 Decelerations: none  Toco: 3-6 mins  MAU Course  Procedures  Lab Results Last 24 Hours    Results for orders placed or performed during the hospital encounter of 03/22/16 (from the past 24 hour(s))  OB RESULT CONSOLE Group B Strep Status: None   Collection Time: 03/22/16 12:00 AM  Result Value Ref Range   GBS Positive   Urinalysis, Routine w reflex microscopic (not at [facility]) Status: Abnormal   Collection Time: 03/22/16 11:22 AM  Result Value Ref Range   Color, Urine YELLOW YELLOW   APPearance CLEAR CLEAR   Specific Gravity, Urine 1.015 1.005 - 1.030   pH 6.0 5.0 - 8.0   Glucose, UA NEGATIVE NEGATIVE mg/dL   Hgb urine dipstick MODERATE (A) NEGATIVE   Bilirubin Urine NEGATIVE NEGATIVE   Ketones, ur NEGATIVE NEGATIVE mg/dL   Protein, ur NEGATIVE NEGATIVE mg/dL   Nitrite NEGATIVE NEGATIVE   Leukocytes, UA TRACE (A) NEGATIVE  Urine microscopic-add on Status: Abnormal   Collection Time: 03/22/16 11:22 AM  Result Value Ref Range   Squamous Epithelial / LPF 0-5 (A) NONE SEEN   WBC, UA 0-5 0 - 5 WBC/hpf   RBC / HPF 0-5 0 - 5 RBC/hpf   Bacteria, UA FEW (A) NONE SEEN   Urine-Other MUCOUS PRESENT       MDM Reactive tracing Ctx every 3-6 minutes First dose of BMZ given PO hydration & procardia 10 mg Q20 mins x 3 doses Ctx improved after meds SVE changed from Wednesday's exam S/w Dr. Su Hiltoberts -- admit to antenatal for observation -- monitoring & second dose of BMZ -- Dr. Les Pouobert's will call NICU  Assessment and Plan  A: 1. Preterm contractions, third trimester     P: Admit to antenatal for observation  Judeth Hornrin Lawrence 03/22/2016, 11:43 AM

## 2016-03-22 NOTE — MAU Note (Signed)
Pt in MAU for preterm uc's, sent by MD office today for continuing uc's.  Feeling them every 5 minutes, SVE @ MD office today - 2 cm's.  Denies LOF, has some spotting from cervical checks.

## 2016-03-23 LAB — GLUCOSE, CAPILLARY
GLUCOSE-CAPILLARY: 112 mg/dL — AB (ref 65–99)
GLUCOSE-CAPILLARY: 203 mg/dL — AB (ref 65–99)
Glucose-Capillary: 100 mg/dL — ABNORMAL HIGH (ref 65–99)
Glucose-Capillary: 191 mg/dL — ABNORMAL HIGH (ref 65–99)

## 2016-03-23 MED ORDER — SODIUM CHLORIDE 0.9% FLUSH: 3.0000 mL | Freq: Two times a day (BID) | INTRAVENOUS | Status: DC

## 2016-03-23 NOTE — Progress Notes (Signed)
OB PN:  S: Pt resting comfortably.  Still feeling mild contractions every few minutes.  No vaginal bleeding, no LOF, +FM.  No headache.  No N/V.  Tolerating gen diet, having regular BMs.  No acute complaints  O: BP 121/63 mmHg  Pulse 93  Temp(Src) 98.3 F (36.8 C) (Oral)  Resp 18  Ht 5\' 4"  (1.626 m)  Wt 86.637 kg (191 lb)  BMI 32.77 kg/m2  SpO2 96%  FHT: 130bpm, moderate variablity, + accels, questionable variable decels vs non-tracing Toco: q4 to irregular contractions SVE: deferred, last exam on arrival: 6/16 2.5/thick/-3  Results for orders placed or performed during the hospital encounter of 03/22/16 (from the past 24 hour(s))  Urinalysis, Routine w reflex microscopic (not at [facility])     Status: Abnormal   Collection Time: 03/22/16 11:22 AM  Result Value Ref Range   Color, Urine YELLOW YELLOW   APPearance CLEAR CLEAR   Specific Gravity, Urine 1.015 1.005 - 1.030   pH 6.0 5.0 - 8.0   Glucose, UA NEGATIVE NEGATIVE mg/dL   Hgb urine dipstick MODERATE (A) NEGATIVE   Bilirubin Urine NEGATIVE NEGATIVE   Ketones, ur NEGATIVE NEGATIVE mg/dL   Protein, ur NEGATIVE NEGATIVE mg/dL   Nitrite NEGATIVE NEGATIVE   Leukocytes, UA TRACE (A) NEGATIVE  Urine microscopic-add on     Status: Abnormal   Collection Time: 03/22/16 11:22 AM  Result Value Ref Range   Squamous Epithelial / LPF 0-5 (A) NONE SEEN   WBC, UA 0-5 0 - 5 WBC/hpf   RBC / HPF 0-5 0 - 5 RBC/hpf   Bacteria, UA FEW (A) NONE SEEN   Urine-Other MUCOUS PRESENT   Type and screen [facility]     Status: None   Collection Time: 03/22/16  1:55 PM  Result Value Ref Range   ABO/RH(D) A POS    Antibody Screen NEG    Sample Expiration 03/25/2016   CBC     Status: Abnormal   Collection Time: 03/22/16  1:55 PM  Result Value Ref Range   WBC 15.2 (H) 4.0 - 10.5 K/uL   RBC 4.13 3.87 - 5.11 MIL/uL   Hemoglobin 12.9 12.0 - 15.0 g/dL   HCT 82.937.8 56.236.0 - 13.046.0 %   MCV 91.5 78.0 - 100.0 fL   MCH 31.2 26.0 - 34.0 pg   MCHC 34.1 30.0 - 36.0 g/dL   RDW 86.513.9 78.411.5 - 69.615.5 %   Platelets 224 150 - 400 K/uL  ABO/Rh     Status: None   Collection Time: 03/22/16  1:55 PM  Result Value Ref Range   ABO/RH(D) A POS   Glucose, capillary     Status: Abnormal   Collection Time: 03/22/16  5:39 PM  Result Value Ref Range   Glucose-Capillary 127 (H) 65 - 99 mg/dL  Glucose, capillary     Status: Abnormal   Collection Time: 03/22/16 11:15 PM  Result Value Ref Range   Glucose-Capillary 117 (H) 65 - 99 mg/dL  Glucose, capillary     Status: Abnormal   Collection Time: 03/23/16  7:49 AM  Result Value Ref Range   Glucose-Capillary 100 (H) 65 - 99 mg/dL   Comment 1 Notify RN      A/P: 35 y.o. [id number] @ 2023w0d admitted for rule out preterm labor 1. FWB: Cat. I 2. Preterm labor -continue IV fluids and magnesium for tocolysis -BMZ #2 due to day at noon -plan for repeat SVE as clinically indicated or in am on 6/18- if  no further cervical dilation plan for discharge home tomorrow 3. GDMA1- diet controlled, accuchecks stable as above 4. Prenatal care -continue prenatal vitmain daily -SCDs while in bed -Colace prn   Myna Hidalgo, DO [phone] (pager) [phone] (office)

## 2016-03-24 NOTE — Progress Notes (Signed)
OB PN:  S: Pt resting comfortably and slept well through the night.  Today, she reports feeling a little crampy, but not having painful contractions.  No vaginal bleeding, no LOF, +FM.  No headache.  No N/V.  Tolerating gen diet, having regular BMs.  No acute complaints  O: BP 105/74 mmHg  Pulse 68  Temp(Src) 99 F (37.2 C) (Oral)  Resp 20  Ht 5\' 4"  (1.626 m)  Wt 86.637 kg (191 lb)  BMI 32.77 kg/m2  SpO2 96%  FHT: 135bpm, moderate variablity, + accels, no decels Toco: q4 to irregular contractions SVE: 2/thick/-3 ballotable  Results for orders placed or performed during the hospital encounter of 03/22/16 (from the past 24 hour(s))  Glucose, capillary     Status: Abnormal   Collection Time: 03/23/16 11:20 AM  Result Value Ref Range   Glucose-Capillary 112 (H) 65 - 99 mg/dL   Comment 1 Notify RN   Glucose, capillary     Status: Abnormal   Collection Time: 03/23/16  4:04 PM  Result Value Ref Range   Glucose-Capillary 191 (H) 65 - 99 mg/dL   Comment 1 Notify RN   Glucose, capillary     Status: Abnormal   Collection Time: 03/23/16  9:58 PM  Result Value Ref Range   Glucose-Capillary 203 (H) 65 - 99 mg/dL   A/P: 35 y.o. [id number] @ 7962w0d admitted for rule out preterm labor 1. FWB: Cat. I 2. Preterm labor -s/p BMZ course and IV Mag/fluids -SVE unchanged and pt now feeling minimal cramping 3. GDMA1- diet controlled, elevation in sugar likely due to steriods, pt to continue to monitor.  Will f/u in office in 1wk 4. Prenatal care -continue prenatal vitmain daily -SCDs while in bed -Colace prn  DISPO: No further cervical change noted, if pt remains asymptomatic and contractions space out will plan for discharge home today with close outpatient follow up.  Myna HidalgoJennifer Selby Slovacek, DO [phone] (pager) [phone] (office)

## 2016-03-24 NOTE — Discharge Instructions (Signed)
Preterm Labor Information Preterm labor is when labor starts at less than 37 weeks of pregnancy. The normal length of a pregnancy is 39 to 41 weeks. CAUSES Often, there is no identifiable underlying cause as to why a woman goes into preterm labor. One of the most common known causes of preterm labor is infection. Infections of the uterus, cervix, vagina, amniotic sac, bladder, kidney, or even the lungs (pneumonia) can cause labor to start. Other suspected causes of preterm labor include:   Urogenital infections, such as yeast infections and bacterial vaginosis.   Uterine abnormalities (uterine shape, uterine septum, fibroids, or bleeding from the placenta).   A cervix that has been operated on (it may fail to stay closed).   Malformations in the fetus.   Multiple gestations (twins, triplets, and so on).   Breakage of the amniotic sac.  RISK FACTORS  Having a previous history of preterm labor.   Having premature rupture of membranes (PROM).   Having a placenta that covers the opening of the cervix (placenta previa).   Having a placenta that separates from the uterus (placental abruption).   Having a cervix that is too weak to hold the fetus in the uterus (incompetent cervix).   Having too much fluid in the amniotic sac (polyhydramnios).   Taking illegal drugs or smoking while pregnant.   Not gaining enough weight while pregnant.   Being younger than 6018 and older than 36 years old.   Having a low socioeconomic status.   Being African American. SYMPTOMS Signs and symptoms of preterm labor include:   Menstrual-like cramps, abdominal pain, or back pain.  Uterine contractions that are regular, as frequent as six in an hour, regardless of their intensity (may be mild or painful).  Contractions that start on the top of the uterus and spread down to the lower abdomen and back.   A sense of increased pelvic pressure.   A watery or bloody mucus discharge that  comes from the vagina.  TREATMENT Depending on the length of the pregnancy and other circumstances, your health care provider may suggest bed rest. If necessary, there are medicines that can be given to stop contractions and to mature the fetal lungs. If labor happens before 34 weeks of pregnancy, a prolonged hospital stay may be recommended. Treatment depends on the condition of both you and the fetus.  WHAT SHOULD YOU DO IF YOU THINK YOU ARE IN PRETERM LABOR? If you start feeling more than 6 contractions in one hour- take the Procardia tablet, increase your oral intake of water and lie down to rest.  If within 1-2 hours you do not notice improvement in the cramping/pain, please call your health care provider right away. You will need to go to the hospital to get checked immediately.  HOW CAN YOU PREVENT PRETERM LABOR IN FUTURE PREGNANCIES? You should:   Stop smoking if you smoke.  Maintain healthy weight gain and avoid chemicals and drugs that are not necessary.  Be watchful for any type of infection.  Inform your health care provider if you have a known history of preterm labor.   This information is not intended to replace advice given to you by your health care provider. Make sure you discuss any questions you have with your health care provider.   Document Released: 12/14/2003 Document Revised: 05/26/2013 Document Reviewed: 10/26/2012 Elsevier Interactive Patient Education [employer]2016 Elsevier Inc.

## 2016-03-24 NOTE — Discharge Summary (Signed)
Physician Discharge Summary  Patient ID: Adriana RiggersJamie L Griffith MRN: [id number] DOB/AGE: 08-Dec-1980 35 y.o.  Admit date: 03/22/2016 Discharge date: 03/24/2016  Admission Diagnoses: Preterm labor  Discharge Diagnoses:  Active Problems:   Preterm contractions   Discharged Condition: stable  Hospital Course: Adriana Jarvis is a 35 y.o. [phone] at 265w6d who presents contractions. Was seen in [place] on Wednesday for same complaint; had negative FFN & discharged home on procardia prn. Went to office this morning for ROV. Last took procardia yesterday. States contractions started again this morning while she was in the office. Feels ctx every 5 minutes and rates them 5/10 on pain scale. Cervix was checked in office & found to be 2 cm dilated. She was admitted for IV tocolytics with Magensium and betamethasone course.  The contractions were intermittent during her stay and eventually were more mild than on admit.  No further cervical change was noted and she was discharged home with plans for close outpatient follow up.  Consults: None  Significant Diagnostic Studies: N/A  Treatments: IV hydration and IV Magnesium and betamethasone for fetal lung maturity  Discharge Exam: Blood pressure 105/74, pulse 68, temperature 99 F (37.2 C), temperature source Oral, resp. rate 20, height 5\' 4"  (1.626 m), weight 86.637 kg (191 lb), SpO2 96 %. Gen NAD Abd: soft, non-tender, gravid FHT: 135bpm, moderate variablity, + accels, no decels  Toco: q4 to irregular contractions  SVE: 2/thick/-3 ballotable  Disposition: 01-Home or Self Care     Medication List    TAKE these medications        acetaminophen 500 MG tablet  Commonly known as:  TYLENOL  Take 500 mg by mouth daily as needed. For pain     hydroxyprogesterone caproate 250 mg/mL Oil injection  Commonly known as:  MAKENA  Inject 250 mg into the muscle once a week.     NIFEdipine 10 MG capsule  Commonly known as:  PROCARDIA  Take 1 capsule (10 mg  total) by mouth every 6 (six) hours as needed (more than 5 contractions per hour).     pantoprazole 20 MG tablet  Commonly known as:  PROTONIX  Take 20 mg by mouth daily.     prenatal multivitamin Tabs tablet  Take 1 tablet by mouth daily at 12 noon.           Follow-up Information    Follow up with Purcell NailsOBERTS,ANGELA Y, MD In 1 week.   Specialty:  Obstetrics and Gynecology   Contact information:   [address] [place] [address] [phone]       Signed: Sharon SellerOZAN, Hazael Olveda, M 03/24/2016, 2:13 PM

## 2016-03-25 LAB — GLUCOSE, CAPILLARY: GLUCOSE-CAPILLARY: 98 mg/dL (ref 65–99)

## 2016-03-28 ENCOUNTER — Encounter: Payer: Self-pay | Admitting: Obstetrics and Gynecology

## 2016-04-21 ENCOUNTER — Encounter (HOSPITAL_COMMUNITY): Payer: Self-pay | Admitting: Certified Nurse Midwife

## 2016-04-21 ENCOUNTER — Inpatient Hospital Stay (HOSPITAL_COMMUNITY): Payer: 59 | Admitting: Anesthesiology

## 2016-04-21 ENCOUNTER — Inpatient Hospital Stay (HOSPITAL_COMMUNITY)
Admission: AD | Admit: 2016-04-21 | Discharge: 2016-04-23 | DRG: 775 | Disposition: A | Discharge status: Home / Self Care | Payer: 59 | Source: Ambulatory Visit | Attending: Obstetrics and Gynecology | Admitting: Obstetrics and Gynecology

## 2016-04-21 DIAGNOSIS — O99824 Streptococcus B carrier state complicating childbirth: Secondary | ICD-10-CM | POA: Diagnosis present

## 2016-04-21 DIAGNOSIS — O4202 Full-term premature rupture of membranes, onset of labor within 24 hours of rupture: Secondary | ICD-10-CM | POA: Diagnosis present

## 2016-04-21 DIAGNOSIS — Z3A37 37 weeks gestation of pregnancy: Secondary | ICD-10-CM | POA: Diagnosis not present

## 2016-04-21 DIAGNOSIS — O9962 Diseases of the digestive system complicating childbirth: Secondary | ICD-10-CM | POA: Diagnosis present

## 2016-04-21 DIAGNOSIS — K219 Gastro-esophageal reflux disease without esophagitis: Secondary | ICD-10-CM | POA: Diagnosis present

## 2016-04-21 DIAGNOSIS — Z87891 Personal history of nicotine dependence: Secondary | ICD-10-CM

## 2016-04-21 DIAGNOSIS — IMO0001 Reserved for inherently not codable concepts without codable children: Secondary | ICD-10-CM

## 2016-04-21 DIAGNOSIS — O24425 Gestational diabetes mellitus in childbirth, controlled by oral hypoglycemic drugs: Secondary | ICD-10-CM | POA: Diagnosis present

## 2016-04-21 LAB — GLUCOSE, CAPILLARY
GLUCOSE-CAPILLARY: 71 mg/dL (ref 65–99)
Glucose-Capillary: 56 mg/dL — ABNORMAL LOW (ref 65–99)
Glucose-Capillary: 115 mg/dL — ABNORMAL HIGH (ref 65–99)

## 2016-04-21 LAB — CBC
HCT: 36.3 % (ref 36.0–46.0)
Hemoglobin: 12.2 g/dL (ref 12.0–15.0)
MCH: 30 pg (ref 26.0–34.0)
MCHC: 33.6 g/dL (ref 30.0–36.0)
MCV: 89.4 fL (ref 78.0–100.0)
PLATELETS: 197 10*3/uL (ref 150–400)
RBC: 4.06 MIL/uL (ref 3.87–5.11)
RDW: 13.8 % (ref 11.5–15.5)
WBC: 12.4 10*3/uL — ABNORMAL HIGH (ref 4.0–10.5)

## 2016-04-21 LAB — TYPE AND SCREEN
ABO/RH(D): A POS
Antibody Screen: NEGATIVE

## 2016-04-21 MED ORDER — OXYTOCIN BOLUS FROM INFUSION
500.0000 mL | INTRAVENOUS | Status: DC
  Administered 2016-04-21: 500 mL via INTRAVENOUS

## 2016-04-21 MED ORDER — LIDOCAINE HCL (PF) 1 % IJ SOLN
30.0000 mL | INTRAMUSCULAR | Status: DC | PRN
  Filled 2016-04-21: qty 30

## 2016-04-21 MED ORDER — DIPHENHYDRAMINE HCL 50 MG/ML IJ SOLN
12.5000 mg | INTRAMUSCULAR | Status: DC | PRN
Start: 2016-04-21 — End: 2016-04-22

## 2016-04-21 MED ORDER — PHENYLEPHRINE 40 MCG/ML (10ML) SYRINGE FOR IV PUSH (FOR BLOOD PRESSURE SUPPORT)
80.0000 ug | PREFILLED_SYRINGE | INTRAVENOUS | Status: DC | PRN
  Filled 2016-04-21: qty 5

## 2016-04-21 MED ORDER — OXYCODONE-ACETAMINOPHEN 5-325 MG PO TABS: 2.0000 | ORAL_TABLET | ORAL | Status: DC | PRN

## 2016-04-21 MED ORDER — EPHEDRINE 5 MG/ML INJ
10.0000 mg | INTRAVENOUS | Status: DC | PRN
  Filled 2016-04-21: qty 2

## 2016-04-21 MED ORDER — DEXTROSE IN LACTATED RINGERS 5 % IV SOLN
INTRAVENOUS | Status: DC
  Administered 2016-04-21: 19:00:00 via INTRAVENOUS

## 2016-04-21 MED ORDER — LACTATED RINGERS IV SOLN: 500.0000 mL | Freq: Once | INTRAVENOUS | Status: DC

## 2016-04-21 MED ORDER — VANCOMYCIN HCL IN DEXTROSE 1-5 GM/200ML-% IV SOLN
1000.0000 mg | Freq: Two times a day (BID) | INTRAVENOUS | Status: DC
  Administered 2016-04-21: 1000 mg via INTRAVENOUS
  Filled 2016-04-21 (×3): qty 200

## 2016-04-21 MED ORDER — PHENYLEPHRINE 40 MCG/ML (10ML) SYRINGE FOR IV PUSH (FOR BLOOD PRESSURE SUPPORT)
PREFILLED_SYRINGE | INTRAVENOUS | Status: AC
  Filled 2016-04-21: qty 20

## 2016-04-21 MED ORDER — LIDOCAINE HCL (PF) 1 % IJ SOLN
INTRAMUSCULAR | Status: DC | PRN
  Administered 2016-04-21 (×2): 4 mL via EPIDURAL

## 2016-04-21 MED ORDER — FENTANYL 2.5 MCG/ML BUPIVACAINE 1/10 % EPIDURAL INFUSION (WH - ANES)
14.0000 mL/h | INTRAMUSCULAR | Status: DC | PRN
  Administered 2016-04-21 (×2): 14 mL/h via EPIDURAL

## 2016-04-21 MED ORDER — OXYCODONE-ACETAMINOPHEN 5-325 MG PO TABS: 1.0000 | ORAL_TABLET | ORAL | Status: DC | PRN

## 2016-04-21 MED ORDER — ONDANSETRON HCL 4 MG/2ML IJ SOLN: 4.0000 mg | Freq: Four times a day (QID) | INTRAMUSCULAR | Status: DC | PRN

## 2016-04-21 MED ORDER — FENTANYL 2.5 MCG/ML BUPIVACAINE 1/10 % EPIDURAL INFUSION (WH - ANES)
INTRAMUSCULAR | Status: DC
Start: 2016-04-21 — End: 2016-04-21
  Filled 2016-04-21: qty 125

## 2016-04-21 MED ORDER — LACTATED RINGERS IV SOLN
INTRAVENOUS | Status: DC
  Administered 2016-04-21: 13:00:00 via INTRAVENOUS

## 2016-04-21 MED ORDER — OXYTOCIN 40 UNITS IN LACTATED RINGERS INFUSION - SIMPLE MED
2.5000 [IU]/h | INTRAVENOUS | Status: DC
  Filled 2016-04-21: qty 1000

## 2016-04-21 MED ORDER — SOD CITRATE-CITRIC ACID 500-334 MG/5ML PO SOLN: 30.0000 mL | ORAL | Status: DC | PRN

## 2016-04-21 MED ORDER — OXYTOCIN 40 UNITS IN LACTATED RINGERS INFUSION - SIMPLE MED: 1.0000 m[IU]/min | INTRAVENOUS | Status: DC

## 2016-04-21 MED ORDER — TERBUTALINE SULFATE 1 MG/ML IJ SOLN
0.2500 mg | Freq: Once | INTRAMUSCULAR | Status: DC | PRN
  Filled 2016-04-21: qty 1

## 2016-04-21 MED ORDER — ACETAMINOPHEN 325 MG PO TABS: 650.0000 mg | ORAL_TABLET | ORAL | Status: DC | PRN

## 2016-04-21 MED ORDER — LACTATED RINGERS IV SOLN: 500.0000 mL | INTRAVENOUS | Status: DC | PRN

## 2016-04-21 NOTE — H&P (Signed)
Adriana Jarvis is a 35 y.o. female presenting for SROM and labor. History OB History    Gravida Para Term Preterm AB TAB SAB Ectopic Multiple Living   3 1  1 1  1   1      Past Medical History  Diagnosis Date  . Blood transfusion without reported diagnosis   . Gestational diabetes   . Pneumonia   . Preterm labor    Past Surgical History  Procedure Laterality Date  . Tonsilectomy, adenoidectomy, bilateral myringotomy and tubes    . Tonsillectomy    . Abdominal surgery      exploratory  . Thumb ligament      Rt thumb   Family History: family history includes Cancer in her other; Hyperlipidemia in her other. Social History:  reports that she quit smoking about 10 months ago. Her smoking use included Cigarettes. She has never used smokeless tobacco. She reports that she does not drink alcohol or use illicit drugs.   Prenatal Transfer Tool  Maternal Diabetes: Yes:  Diabetes Type:  Insulin/Medication controlled Genetic Screening: Normal Maternal Ultrasounds/Referrals: Normal Fetal Ultrasounds or other Referrals:  None Maternal Substance Abuse:  No Significant Maternal Medications:  Meds include: Other:  Significant Maternal Lab Results:  None Other Comments:  Pt on glyburide 2.5 mg bid  ROS  Dilation: 4.5 Effacement (%): 60 Station: -2 Exam by:: A. Yetta BarreJones RN Blood pressure 123/71, pulse 78, temperature 98.2 F (36.8 C), temperature source Oral, resp. rate 18, height 5\' 4"  (1.626 m), weight 181 lb (82.101 kg). Exam Physical Exam  Prenatal labs: ABO, Rh: --/--/A POS (07/16 1300) Antibody: NEG (07/16 1300) Rubella: Immune (12/12 0000) RPR: Nonreactive (05/10 0000)  HBsAg: Negative (12/12 0000)  HIV: Non-reactive (12/12 0000)  GBS: Positive (06/16 0000)   Assessment/Plan: SROM WITH LABOR Admit to L&D  BS q 4 hours Pain management per pt Cat 1 fetal status Anticipate SVD   Marvin Maenza A 04/21/2016, 3:49 PM

## 2016-04-21 NOTE — Progress Notes (Signed)
Pt and her husband state pt passed quarter sized dark red blood clot while on toilet.

## 2016-04-21 NOTE — MAU Note (Addendum)
First gush of fluid a few hrs ago.  Trickled and more gushes, clear fluid continues. No bleeding.  Having contractions, not regular, but more intense than they have been.

## 2016-04-21 NOTE — Anesthesia Preprocedure Evaluation (Addendum)
Anesthesia Evaluation  Patient identified by MRN, date of birth, ID band Patient awake    Reviewed: Allergy & Precautions, Patient's Chart, lab work & pertinent test results  Airway Mallampati: III  TM Distance: >3 FB Neck ROM: Full    Dental no notable dental hx. (+) Teeth Intact   Pulmonary pneumonia, resolved, former smoker,    Pulmonary exam normal        Cardiovascular negative cardio ROS Normal cardiovascular exam Rhythm:Regular Rate:Normal     Neuro/Psych negative neurological ROS  negative psych ROS   GI/Hepatic Neg liver ROS, GERD  Medicated and Controlled,  Endo/Other  diabetes, Well Controlled, Gestational, Oral Hypoglycemic Agents  Renal/GU negative Renal ROS  negative genitourinary   Musculoskeletal negative musculoskeletal ROS (+)   Abdominal (+) + obese,   Peds  Hematology Hx/o Blood transfusion   Anesthesia Other Findings   Reproductive/Obstetrics (+) Pregnancy                            Anesthesia Physical Anesthesia Plan  ASA: II  Anesthesia Plan: Epidural   Post-op Pain Management:    Induction:   Airway Management Planned: Natural Airway  Additional Equipment:   Intra-op Plan:   Post-operative Plan:   Informed Consent: I have reviewed the patients History and Physical, chart, labs and discussed the procedure including the risks, benefits and alternatives for the proposed anesthesia with the patient or authorized representative who has indicated his/her understanding and acceptance.     Plan Discussed with:   Anesthesia Plan Comments:         Anesthesia Quick Evaluation

## 2016-04-21 NOTE — Progress Notes (Signed)
1850 pt given juice to drink.

## 2016-04-21 NOTE — Anesthesia Pain Management Evaluation Note (Signed)
  [facility] Pain Management Visit Note  Patient: Adriana RiggersJamie L Bevel, 35 y.o., female  "Hello I am a member of the anesthesia team at [facility]. We have an anesthesia team available at all times to provide care throughout the hospital, including epidural management and anesthesia for C-section. I don't know your plan for the delivery whether it a natural birth, water birth, IV sedation, nitrous supplementation, doula or epidural, but we want to meet your pain goals."   1.Was your pain managed to your expectations on prior hospitalizations?   yes  2.What is your expectation for pain management during this hospitalization?    N2O  3.How can we help you reach that goal? Educate/support  Record the patient's initial score and the patient's pain goal.   Pain: 4  Pain Goal:10  [facility] wants you to be able to say your pain was always managed very well.  [facility]INKLE,Jago Carton 04/21/2016

## 2016-04-21 NOTE — Anesthesia Procedure Notes (Signed)
Epidural Patient location during procedure: OB Start time: 04/21/2016 9:17 PM  Preanesthetic Checklist Completed: patient identified, site marked, surgical consent, pre-op evaluation, timeout performed, IV checked, risks and benefits discussed and monitors and equipment checked  Epidural Patient position: sitting Prep: site prepped and draped and DuraPrep Patient monitoring: continuous pulse ox and blood pressure Approach: midline Location: L3-L4 Injection technique: LOR air  Needle:  Needle type: Tuohy  Needle gauge: 17 G Needle length: 9 cm and 9 Needle insertion depth: 4 cm Catheter type: closed end flexible Catheter size: 19 Gauge Catheter at skin depth: 9 cm Test dose: negative and Other  Assessment Events: blood not aspirated, injection not painful, no injection resistance, negative IV test and no paresthesia  Additional Notes Patient identified. Risks and benefits discussed including failed block, incomplete  Pain control, post dural puncture headache, nerve damage, paralysis, blood pressure Changes, nausea, vomiting, reactions to medications-both toxic and allergic and post Partum back pain. All questions were answered. Patient expressed understanding and wished to proceed. Sterile technique was used throughout procedure. Epidural site was Dressed with sterile barrier dressing. No paresthesias, signs of intravascular injection Or signs of intrathecal spread were encountered.  Patient was more comfortable after the epidural was dosed. Please see RN's note for documentation of vital signs and FHR which are stable.

## 2016-04-21 NOTE — Progress Notes (Addendum)
CRITICAL VALUE ALERT  Critical value received:  cbg 57  Date of notification:  04/21/2016  Time of notification:  1845  Critical value read back:Yes.    Nurse who received alert:  Becky Ortencia Askari,rnc-ob  MD notified (1st page):  1845  Time of first page:  1845  MD notified (2nd page):  Time of second page:  Responding MD:  Dr Normand Sloopdillard  Time MD responded:  [phone]

## 2016-04-21 NOTE — Progress Notes (Signed)
Ajones, RN in MAU called to verify if MD had been called and Admission orders received. Ajones, RN  States Dr Normand Sloopillard called and is en route to see pt and place orders when in house.

## 2016-04-21 NOTE — Progress Notes (Signed)
Assuming care of Millie L. Lillia MountainWilkins, 35 yo [id number] @ 37.1 wks admitted for SROM, clear fluid at 0730 today. Spouse at bedside.  Subjective: Ctxs becoming unbearable, states "Nitrous only makes my face numb." Requests Epidural.  Objective: BP 124/78 mmHg  Pulse 87  Temp(Src) 97.7 F (36.5 C) (Oral)  Resp 20  Ht 5\' 4"  (1.626 m)  Wt 82.101 kg (181 lb)  BMI 31.05 kg/m2 Today's Vitals   04/21/16 1747 04/21/16 1825 04/21/16 1834 04/21/16 1931  BP:   124/78   Pulse:   87   Temp:   97.7 F (36.5 C)   TempSrc:   Oral   Resp: 18  20   Height:      Weight:      PainSc: 6  8   7    Tmax 98.3  Results for orders placed or performed during the hospital encounter of 04/21/16 (from the past 24 hour(s))  CBC     Status: Abnormal   Collection Time: 04/21/16  1:00 PM  Result Value Ref Range   WBC 12.4 (H) 4.0 - 10.5 K/uL   RBC 4.06 3.87 - 5.11 MIL/uL   Hemoglobin 12.2 12.0 - 15.0 g/dL   HCT 45.436.3 09.836.0 - 11.946.0 %   MCV 89.4 78.0 - 100.0 fL   MCH 30.0 26.0 - 34.0 pg   MCHC 33.6 30.0 - 36.0 g/dL   RDW 14.713.8 82.911.5 - 56.215.5 %   Platelets 197 150 - 400 K/uL  [facility] and [facility]     Status: None   Collection Time: 04/21/16  1:00 PM  Result Value Ref Range   ABO/RH(D) A POS    Antibody Screen NEG    Sample Expiration 04/24/2016   Glucose, capillary     Status: None   Collection Time: 04/21/16  2:13 PM  Result Value Ref Range   Glucose-Capillary 71 65 - 99 mg/dL   Comment 1 Notify RN   Glucose, capillary     Status: Abnormal   Collection Time: 04/21/16  6:37 PM  Result Value Ref Range   Glucose-Capillary 56 (L) 65 - 99 mg/dL   Comment 1 Notify RN   Glucose, capillary     Status: Abnormal   Collection Time: 04/21/16  8:02 PM  Result Value Ref Range   Glucose-Capillary 115 (H) 65 - 99 mg/dL   Skin: cool to the touch FHT: BL 120 w/ moderate variability, +accels, no decels UC:   irregular, every 5-6 minutes by palpation - difficult to trace externally SVE:   Dilation:  6 Effacement (%): 90 Station: -1 Exam by:: kim Johnaton Sonneborn,cnm@ 20:35  Vancomycin IV #1 given at 1423  CBG 115 at 20:02 -- s/p juice at 1850 and initiation of D5LR  EFW: 7 1/2 lbs  Cephalic by Leopold's and VE  Assessment:  IUP at term Active phase of labor A2GDM - stable sugars No cervical change over 2 hrs SROM x 13 hrs; no s/s of infection Cat 1 FHRT GBS positive - Amoxicillin allergy   Plan: Epidural now, then low dose Pitocin. Risks and benefits reviewed, including prolonged labor, need for further intervention & risk of cesarean. Pt verbalized understanding and desires to proceed. Will continue D5LR per Dr. Estanislado Pandyivard. IUPC placement w/ next cervical exam. Expect progress and SVD.  Sherre ScarletWILLIAMS, Ronae Noell CNM 04/21/2016, 8:53 PM    ADDENDUM: Pt progressed to 10 cm immediately after Epidural placement. Pitocin was not started nor was IUPC placed.   Sherre ScarletKimberly Martavius Lusty, CNM 04/21/16, 11 PM

## 2016-04-22 ENCOUNTER — Encounter (HOSPITAL_COMMUNITY): Payer: Self-pay | Admitting: *Deleted

## 2016-04-22 LAB — GLUCOSE, CAPILLARY
GLUCOSE-CAPILLARY: 105 mg/dL — AB (ref 65–99)
Glucose-Capillary: 196 mg/dL — ABNORMAL HIGH (ref 65–99)
Glucose-Capillary: 95 mg/dL (ref 65–99)
Glucose-Capillary: 153 mg/dL — ABNORMAL HIGH (ref 65–99)
Glucose-Capillary: 164 mg/dL — ABNORMAL HIGH (ref 65–99)

## 2016-04-22 LAB — CBC
HEMATOCRIT: 33.1 % — AB (ref 36.0–46.0)
HEMOGLOBIN: 11.1 g/dL — AB (ref 12.0–15.0)
MCH: 29.9 pg (ref 26.0–34.0)
MCHC: 33.5 g/dL (ref 30.0–36.0)
MCV: 89.2 fL (ref 78.0–100.0)
Platelets: 160 10*3/uL (ref 150–400)
RBC: 3.71 MIL/uL — ABNORMAL LOW (ref 3.87–5.11)
RDW: 13.9 % (ref 11.5–15.5)
WBC: 16.2 10*3/uL — ABNORMAL HIGH (ref 4.0–10.5)

## 2016-04-22 MED ORDER — BENZOCAINE-MENTHOL 20-0.5 % EX AERO
1.0000 "application " | INHALATION_SPRAY | CUTANEOUS | Status: DC | PRN
  Administered 2016-04-22: 1 via TOPICAL
  Filled 2016-04-22: qty 56

## 2016-04-22 MED ORDER — DIPHENHYDRAMINE HCL 25 MG PO CAPS
25.0000 mg | ORAL_CAPSULE | Freq: Four times a day (QID) | ORAL | Status: DC | PRN
Start: 2016-04-22 — End: 2016-04-23

## 2016-04-22 MED ORDER — ONDANSETRON HCL 4 MG PO TABS: 4.0000 mg | ORAL_TABLET | ORAL | Status: DC | PRN

## 2016-04-22 MED ORDER — ONDANSETRON HCL 4 MG/2ML IJ SOLN: 4.0000 mg | INTRAMUSCULAR | Status: DC | PRN

## 2016-04-22 MED ORDER — IBUPROFEN 600 MG PO TABS
600.0000 mg | ORAL_TABLET | Freq: Four times a day (QID) | ORAL | Status: DC
  Administered 2016-04-22 – 2016-04-23 (×5): 600 mg via ORAL
  Filled 2016-04-22 (×5): qty 1

## 2016-04-22 MED ORDER — SIMETHICONE 80 MG PO CHEW: 80.0000 mg | CHEWABLE_TABLET | ORAL | Status: DC | PRN

## 2016-04-22 MED ORDER — OXYCODONE-ACETAMINOPHEN 5-325 MG PO TABS: 2.0000 | ORAL_TABLET | ORAL | Status: DC | PRN

## 2016-04-22 MED ORDER — WITCH HAZEL-GLYCERIN EX PADS: 1.0000 "application " | MEDICATED_PAD | CUTANEOUS | Status: DC | PRN

## 2016-04-22 MED ORDER — DIBUCAINE 1 % RE OINT: 1.0000 "application " | TOPICAL_OINTMENT | RECTAL | Status: DC | PRN

## 2016-04-22 MED ORDER — ACETAMINOPHEN 325 MG PO TABS
650.0000 mg | ORAL_TABLET | ORAL | Status: DC | PRN
Start: 2016-04-22 — End: 2016-04-23

## 2016-04-22 MED ORDER — MEDROXYPROGESTERONE ACETATE 150 MG/ML IM SUSP: 150.0000 mg | INTRAMUSCULAR | Status: DC | PRN

## 2016-04-22 MED ORDER — PRENATAL MULTIVITAMIN CH
1.0000 | ORAL_TABLET | Freq: Every day | ORAL | Status: DC
  Administered 2016-04-22: 1 via ORAL
  Filled 2016-04-22: qty 1

## 2016-04-22 MED ORDER — TETANUS-DIPHTH-ACELL PERTUSSIS 5-2.5-18.5 LF-MCG/0.5 IM SUSP: 0.5000 mL | Freq: Once | INTRAMUSCULAR | Status: DC

## 2016-04-22 MED ORDER — COCONUT OIL OIL
1.0000 "application " | TOPICAL_OIL | Status: DC | PRN
  Administered 2016-04-23: 1 via TOPICAL

## 2016-04-22 MED ORDER — FERROUS SULFATE 325 (65 FE) MG PO TABS: 325.0000 mg | ORAL_TABLET | Freq: Two times a day (BID) | ORAL | Status: DC

## 2016-04-22 MED ORDER — OXYCODONE-ACETAMINOPHEN 5-325 MG PO TABS
1.0000 | ORAL_TABLET | ORAL | Status: DC | PRN
Start: 2016-04-22 — End: 2016-04-23

## 2016-04-22 MED ORDER — SENNOSIDES-DOCUSATE SODIUM 8.6-50 MG PO TABS
2.0000 | ORAL_TABLET | ORAL | Status: DC
  Administered 2016-04-22: 2 via ORAL
  Filled 2016-04-22: qty 2

## 2016-04-22 MED ORDER — ZOLPIDEM TARTRATE 5 MG PO TABS
5.0000 mg | ORAL_TABLET | Freq: Every evening | ORAL | Status: DC | PRN
Start: 2016-04-22 — End: 2016-04-23

## 2016-04-22 NOTE — Anesthesia Postprocedure Evaluation (Signed)
Anesthesia Post Note  Patient: Janeece RiggersJamie L Gaynor  Procedure(s) Performed: * No procedures listed *  Patient location during evaluation: Mother Baby Anesthesia Type: Epidural Level of consciousness: awake, awake and alert, oriented and patient cooperative Pain management: pain level controlled Vital Signs Assessment: post-procedure vital signs reviewed and stable Respiratory status: spontaneous breathing, nonlabored ventilation and respiratory function stable Cardiovascular status: stable Postop Assessment: no headache, no backache, patient able to bend at knees and no signs of nausea or vomiting Anesthetic complications: no     Last Vitals:  Filed Vitals:   04/22/16 0205 04/22/16 0554  BP: 124/54 130/72  Pulse: 78 82  Temp: 36.7 C 36.6 C  Resp: 18 18    Last Pain:  Filed Vitals:   04/22/16 0557  PainSc: 2    Pain Goal: Patients Stated Pain Goal: 2 (04/22/16 0554)               Alee Gressman L

## 2016-04-22 NOTE — Progress Notes (Signed)
Inpatient Diabetes Program Recommendations  AACE/ADA: New Consensus Statement on Inpatient Glycemic Control (2015)  Target Ranges:  Prepandial:   less than 140 mg/dL      Peak postprandial:   less than 180 mg/dL (1-2 hours)      Critically ill patients:  140 - 180 mg/dL  Results for Adriana Jarvis, Adriana Jarvis (MRN [id number]) as of 04/22/2016 11:39  Ref. Range 04/21/2016 14:13 04/21/2016 18:37 04/21/2016 20:02 04/22/2016 00:13 04/22/2016 08:32  Glucose-Capillary Latest Ref Range: 65-99 mg/dL 71 56 (Jarvis) 409115 (H) 811105 (H) 196 (H)   Review of Glycemic Control  Diabetes history: GDM Outpatient Diabetes medications: Diabeta 2.5 mg TID  Current orders for Inpatient glycemic control: CBGs fasting and 2 hour post prandial  Inpatient Diabetes Program Recommendations:  Outpatient Referral: Note glucose 196 mg/dl at 9:148:32 am today. May want to consider discharging patient on oral DM medication if CBGs remain elevated and have patient follow up with PCP regarding glycemic control.  Thanks, Orlando PennerMarie Luetta Piazza, RN, MSN, CDE Diabetes Coordinator Inpatient Diabetes Program [phone] (Team Pager from 8am to 5pm) [phone] ([facility] office) [phone] [facility] office) [phone] [facility] office)

## 2016-04-22 NOTE — Lactation Note (Signed)
This note was copied from a baby's chart. Lactation Consultation Note Moms 1st child was premie, mom pumped breast milk and bottle fed. Mom is BF this baby, states BF well these past couple of feedings. Mom has everted nipples, compress well. Noted knots in breast. Mom states she a knots that the MD is aware of and are OK. Mom encouraged to feed baby 8-12 times/24 hours and with feeding cues.  Encouraged STS and offer BF d/t low bld. Sugar. Hand expressed, no colostrum noted. Moms breast are tender. Mom stated she has leaked some before delivery. Educated about newborn behavior, I&O, cluster feeding, supply and demand. Referred to Baby and Me Book in Breastfeeding section Pg. 22-23 for position options and Proper latch demonstration.WH/LC brochure given w/resources, support groups and LC services. Patient Name: Boy Ruben ReasonJamie Yaden VHQIO'NToday's Date: 04/22/2016 Reason for consult: Initial assessment   Maternal Data Has patient been taught Hand Expression?: Yes Does the patient have breastfeeding experience prior to this delivery?: Yes  Feeding Feeding Type: Breast Fed Length of feed: 15 min  LATCH Score/Interventions Latch: Repeated attempts needed to sustain latch, nipple held in mouth throughout feeding, stimulation needed to elicit sucking reflex. Intervention(s): Adjust position;Assist with latch  Audible Swallowing: A few with stimulation Intervention(s): Skin to skin  Type of Nipple: Everted at rest and after stimulation  Comfort (Breast/Nipple): Soft / non-tender     Hold (Positioning): No assistance needed to correctly position infant at breast. Intervention(s): Skin to skin;Position options;Support Pillows;Breastfeeding basics reviewed  LATCH Score: 8  Lactation Tools Discussed/Used WIC Program: No   Consult Status Consult Status: Follow-up Date: 04/22/16 (in pm) Follow-up type: In-patient    Charyl DancerCARVER, Jahira Swiss G 04/22/2016, 2:37 AM

## 2016-04-22 NOTE — Progress Notes (Signed)
Post Partum Day 1 Subjective: up ad lib and tolerating PO  Objective: Blood pressure 130/72, pulse 82, temperature 97.8 F (36.6 C), temperature source Oral, resp. rate 18, height 5\' 4"  (1.626 m), weight 181 lb (82.101 kg), SpO2 98 %, unknown if currently breastfeeding.  Physical Exam:  General: alert and no distress Lochia: appropriate Uterine Fundus: firm Incision: NA DVT Evaluation: No evidence of DVT seen on physical exam.   Recent Labs  04/21/16 1300 04/22/16 0507  HGB 12.2 11.1*  HCT 36.3 33.1*    Assessment/Plan: Plan for discharge tomorrow and Contraception Vasectomy  Circ discussed. Questions answered.   LOS: 1 day   Beonca Gibb V 04/22/2016, 11:10 AM

## 2016-04-23 MED ORDER — IBUPROFEN 600 MG PO TABS: 600.0000 mg | ORAL_TABLET | Freq: Four times a day (QID) | ORAL | Status: AC

## 2016-04-23 NOTE — Discharge Summary (Signed)
Obstetric Discharge Summary  Reason for Admission: onset of labor on 04/21/16 Prenatal Procedures: none Intrapartum Procedures: spontaneous vaginal delivery by Merlinda FrederickKimberley Williams CNM on 04/22/16 Postpartum Procedures: none Complications-Operative and Postpartum: 2nd degree perineal laceration  HEMOGLOBIN  Date Value Ref Range Status  04/22/2016 11.1* 12.0 - 15.0 g/dL Final   HCT  Date Value Ref Range Status  04/22/2016 33.1* 36.0 - 46.0 % Final    Discharge Diagnoses: Term Pregnancy-delivered  Physical exam:   General: normal Lochia: appropriate Uterine Fundus: 0/2 firm non-tender  Extremities: No evidence of DVT seen on physical exam. Edema minimal   Hospital course: uncomplicated  Date: 04/23/2016 Activity: unrestricted Diet: routine Medications: Ibuprofen Condition: stable  Breastfeeding:   Yes.   Contraception:  vasectomy  Instructions: refer to practice specific booklet Discharge to: home   Newborn Data:   Baby female Name: Adriana Jarvis  Natividad Schlosser A MD 04/23/2016, 6:27 AM

## 2016-04-23 NOTE — Lactation Note (Signed)
This note was copied from a baby's chart. Lactation Consultation Note  Patient Name: Adriana Jarvis ReasonJamie Hartgrove ZOXWR'UToday's Date: 04/23/2016  Follow up visit made prior to discharge.  Mom states baby has been cluster feeding.  Nipples tender but coconut oil is helping.  Reviewed importance of effective feedings by using good waking techniques and breast massage.  Instructed on engorgement treatment.  Lactation outpatient services and support information reviewed and encouraged.   Maternal Data    Feeding    LATCH Score/Interventions                      Lactation Tools Discussed/Used     Consult Status      Huston FoleyMOULDEN, Keyshun Elpers S 04/23/2016, 9:58 AM

## 2016-04-23 NOTE — Progress Notes (Signed)
Discharge instructions reviewed as well as follow-up instructions.  All questions answered.  No equipment sent home with patient, all belongings at bedside sent home with patient.  Mom and baby escorted to main entrance by NT. In stable condition upon discharge.

## 2016-04-23 NOTE — Discharge Instructions (Signed)

## 2017-01-29 DIAGNOSIS — Z79899 Other long term (current) drug therapy: Secondary | ICD-10-CM | POA: Diagnosis not present

## 2017-03-06 DIAGNOSIS — Z79899 Other long term (current) drug therapy: Secondary | ICD-10-CM | POA: Diagnosis not present

## 2017-06-02 DIAGNOSIS — Z79899 Other long term (current) drug therapy: Secondary | ICD-10-CM | POA: Diagnosis not present

## 2017-07-14 DIAGNOSIS — Z23 Encounter for immunization: Secondary | ICD-10-CM | POA: Diagnosis not present

## 2017-08-08 DIAGNOSIS — Z79899 Other long term (current) drug therapy: Secondary | ICD-10-CM | POA: Diagnosis not present

## 2017-11-06 DIAGNOSIS — Z79899 Other long term (current) drug therapy: Secondary | ICD-10-CM | POA: Diagnosis not present

## 2017-11-25 DIAGNOSIS — J029 Acute pharyngitis, unspecified: Secondary | ICD-10-CM | POA: Diagnosis not present

## 2018-01-29 DIAGNOSIS — Z79899 Other long term (current) drug therapy: Secondary | ICD-10-CM | POA: Diagnosis not present

## 2018-03-19 DIAGNOSIS — M71571 Other bursitis, not elsewhere classified, right ankle and foot: Secondary | ICD-10-CM | POA: Diagnosis not present

## 2018-03-19 DIAGNOSIS — M71572 Other bursitis, not elsewhere classified, left ankle and foot: Secondary | ICD-10-CM | POA: Diagnosis not present

## 2018-03-19 DIAGNOSIS — M25872 Other specified joint disorders, left ankle and foot: Secondary | ICD-10-CM | POA: Diagnosis not present

## 2018-04-02 DIAGNOSIS — M21621 Bunionette of right foot: Secondary | ICD-10-CM | POA: Diagnosis not present

## 2018-04-30 DIAGNOSIS — Z79899 Other long term (current) drug therapy: Secondary | ICD-10-CM | POA: Diagnosis not present

## 2018-05-08 DIAGNOSIS — R7301 Impaired fasting glucose: Secondary | ICD-10-CM | POA: Diagnosis not present

## 2018-05-27 ENCOUNTER — Encounter (HOSPITAL_COMMUNITY): Payer: Self-pay | Admitting: Emergency Medicine

## 2018-05-27 ENCOUNTER — Emergency Department (HOSPITAL_COMMUNITY)
Admission: EM | Admit: 2018-05-27 | Discharge: 2018-05-27 | Disposition: A | Discharge status: Home / Self Care | Payer: 59 | Attending: Emergency Medicine | Admitting: Emergency Medicine

## 2018-05-27 ENCOUNTER — Other Ambulatory Visit: Payer: Self-pay

## 2018-05-27 ENCOUNTER — Emergency Department (HOSPITAL_COMMUNITY): Payer: 59

## 2018-05-27 DIAGNOSIS — K5732 Diverticulitis of large intestine without perforation or abscess without bleeding: Secondary | ICD-10-CM | POA: Diagnosis not present

## 2018-05-27 DIAGNOSIS — Z87891 Personal history of nicotine dependence: Secondary | ICD-10-CM | POA: Insufficient documentation

## 2018-05-27 DIAGNOSIS — R1032 Left lower quadrant pain: Secondary | ICD-10-CM | POA: Diagnosis present

## 2018-05-27 DIAGNOSIS — Z79899 Other long term (current) drug therapy: Secondary | ICD-10-CM | POA: Insufficient documentation

## 2018-05-27 DIAGNOSIS — K5792 Diverticulitis of intestine, part unspecified, without perforation or abscess without bleeding: Secondary | ICD-10-CM

## 2018-05-27 DIAGNOSIS — R000 Tachycardia, unspecified: Secondary | ICD-10-CM | POA: Diagnosis not present

## 2018-05-27 LAB — CBC WITH DIFFERENTIAL/PLATELET
Basophils Absolute: 0 10*3/uL (ref 0.0–0.1)
Basophils Relative: 0 %
Eosinophils Absolute: 0.1 10*3/uL (ref 0.0–0.7)
Eosinophils Relative: 1 %
HCT: 48.8 % — ABNORMAL HIGH (ref 36.0–46.0)
Hemoglobin: 16.9 g/dL — ABNORMAL HIGH (ref 12.0–15.0)
Lymphocytes Relative: 12 %
Lymphs Abs: 2.1 10*3/uL (ref 0.7–4.0)
MCH: 32.4 pg (ref 26.0–34.0)
MCHC: 34.6 g/dL (ref 30.0–36.0)
MCV: 93.7 fL (ref 78.0–100.0)
Monocytes Absolute: 0.9 10*3/uL (ref 0.1–1.0)
Monocytes Relative: 5 %
Neutro Abs: 14.1 10*3/uL — ABNORMAL HIGH (ref 1.7–7.7)
Neutrophils Relative %: 82 %
Platelets: 269 10*3/uL (ref 150–400)
RBC: 5.21 MIL/uL — ABNORMAL HIGH (ref 3.87–5.11)
RDW: 13.6 % (ref 11.5–15.5)
WBC: 17.2 10*3/uL — ABNORMAL HIGH (ref 4.0–10.5)

## 2018-05-27 LAB — COMPREHENSIVE METABOLIC PANEL
ALT: 45 U/L — ABNORMAL HIGH (ref 0–44)
AST: 29 U/L (ref 15–41)
Albumin: 5 g/dL (ref 3.5–5.0)
Alkaline Phosphatase: 99 U/L (ref 38–126)
Anion gap: 11 (ref 5–15)
BUN: 17 mg/dL (ref 6–20)
CO2: 23 mmol/L (ref 22–32)
Calcium: 9.6 mg/dL (ref 8.9–10.3)
Chloride: 106 mmol/L (ref 98–111)
Creatinine, Ser: 0.8 mg/dL (ref 0.44–1.00)
GFR calc Af Amer: >60 mL/min (ref >60)
GFR calc non Af Amer: >60 mL/min (ref >60)
Glucose, Bld: 121 mg/dL — ABNORMAL HIGH (ref 70–99)
Potassium: 4.4 mmol/L (ref 3.5–5.1)
Sodium: 140 mmol/L (ref 135–145)
Total Bilirubin: 0.6 mg/dL (ref 0.3–1.2)
Total Protein: 8 g/dL (ref 6.5–8.1)

## 2018-05-27 LAB — URINALYSIS, ROUTINE W REFLEX MICROSCOPIC
BILIRUBIN URINE: NEGATIVE
Glucose, UA: NEGATIVE mg/dL
Hgb urine dipstick: NEGATIVE
Ketones, ur: 20 mg/dL — AB
Leukocytes, UA: NEGATIVE
NITRITE: NEGATIVE
Protein, ur: NEGATIVE mg/dL
Specific Gravity, Urine: 1.024 (ref 1.005–1.030)
pH: 5 (ref 5.0–8.0)

## 2018-05-27 LAB — I-STAT BETA HCG BLOOD, ED (MC, WL, AP ONLY): I-stat hCG, quantitative: <5 m[IU]/mL (ref <5)

## 2018-05-27 LAB — LIPASE, BLOOD: Lipase: 28 U/L (ref 11–51)

## 2018-05-27 MED ORDER — ONDANSETRON HCL 4 MG PO TABS: 4.0000 mg | ORAL_TABLET | Freq: Three times a day (TID) | ORAL | 0 refills | Status: AC | PRN

## 2018-05-27 MED ORDER — HYDROMORPHONE HCL 1 MG/ML IJ SOLN
1.0000 mg | Freq: Once | INTRAMUSCULAR | Status: AC
  Administered 2018-05-27: 1 mg via INTRAVENOUS
  Filled 2018-05-27: qty 1

## 2018-05-27 MED ORDER — MORPHINE SULFATE (PF) 4 MG/ML IV SOLN
4.0000 mg | Freq: Once | INTRAVENOUS | Status: AC
  Administered 2018-05-27: 4 mg via INTRAVENOUS
  Filled 2018-05-27: qty 1

## 2018-05-27 MED ORDER — CIPROFLOXACIN HCL 500 MG PO TABS: 500.0000 mg | ORAL_TABLET | Freq: Two times a day (BID) | ORAL | 0 refills | Status: AC

## 2018-05-27 MED ORDER — METRONIDAZOLE 500 MG PO TABS: 500.0000 mg | ORAL_TABLET | Freq: Two times a day (BID) | ORAL | 0 refills | Status: AC

## 2018-05-27 MED ORDER — FENTANYL CITRATE (PF) 100 MCG/2ML IJ SOLN
50.0000 ug | Freq: Once | INTRAMUSCULAR | Status: AC
  Administered 2018-05-27: 50 ug via INTRAVENOUS
  Filled 2018-05-27: qty 2

## 2018-05-27 MED ORDER — ONDANSETRON HCL 4 MG/2ML IJ SOLN
4.0000 mg | Freq: Once | INTRAMUSCULAR | Status: AC
  Administered 2018-05-27: 4 mg via INTRAVENOUS
  Filled 2018-05-27: qty 2

## 2018-05-27 MED ORDER — HYDROCODONE-ACETAMINOPHEN 5-325 MG PO TABS: 1.0000 | ORAL_TABLET | Freq: Four times a day (QID) | ORAL | 0 refills | Status: AC | PRN

## 2018-05-27 MED ORDER — KETOROLAC TROMETHAMINE 15 MG/ML IJ SOLN
15.0000 mg | Freq: Once | INTRAMUSCULAR | Status: AC
  Administered 2018-05-27: 15 mg via INTRAVENOUS
  Filled 2018-05-27: qty 1

## 2018-05-27 NOTE — ED Notes (Signed)
ED Provider at bedside. 

## 2018-05-27 NOTE — ED Notes (Signed)
Family at bedside. 

## 2018-05-27 NOTE — ED Provider Notes (Signed)
[facility]-EMERGENCY DEPT Provider Note   CSN: [id number] Arrival date & time: 05/27/18  0915     History   Chief Complaint Chief Complaint  Patient presents with  . Abdominal Pain    HPI Adriana Jarvis is a 37 y.o. female presented for evaluation of left-sided abdominal pain.  Patient states acutely at 3:00 this morning she developed left-sided abdominal pain.  Tried Tums without improvement of pain.  Since then, pain has gradually worsened, is now severe and constant.  Patient states it radiates to her back.  She denies history of similar pain.  She has not had anything to eat or drink today.  She denies worsening of pain with urination or bowel movements.  She denies fevers, chills, chest pain, shortness of breath, urinary symptoms, abnormal bowel movements.  She reports associated nausea without vomiting.  She reports no symptoms yesterday.  She denies a history of kidney stones.  Patient states she takes Vyvanse and rosuvastatin daily, no other medications.  Last period was 3 weeks ago.  She denies vaginal bleeding or discharge.  HPI  Past Medical History:  Diagnosis Date  . Blood transfusion without reported diagnosis   . Gestational diabetes   . Pneumonia   . Preterm labor     Patient Active Problem List   Diagnosis Date Noted  . Vaginal delivery 04/22/2016  . Second-degree perineal laceration, with delivery 04/22/2016    Past Surgical History:  Procedure Laterality Date  . ABDOMINAL SURGERY     exploratory  . Thumb ligament     Rt thumb  . TONSILECTOMY, ADENOIDECTOMY, BILATERAL MYRINGOTOMY AND TUBES    . TONSILLECTOMY       OB History    Gravida  3   Para  2   Term  1   Preterm  1   AB  1   Living  2     SAB  1   TAB      Ectopic      Multiple  0   Live Births  2            Home Medications    Prior to Admission medications   Medication Sig Start Date End Date Taking? Authorizing Provider  acetaminophen  (TYLENOL) 500 MG tablet Take 1,000 mg by mouth every 6 (six) hours as needed for mild pain or headache.    Yes [provider]  ibuprofen (ADVIL,MOTRIN) 600 MG tablet Take 1 tablet (600 mg total) by mouth every 6 (six) hours. 04/23/16  Yes Rivard, Dois Davenport, MD  rosuvastatin (CRESTOR) 20 MG tablet Take 20 mg by mouth daily. 05/08/18  Yes [provider]  VYVANSE 50 MG capsule Take 50 mg by mouth daily. 05/20/18  Yes [provider]  CHANTIX STARTING MONTH PAK 0.5 MG X 11 & 1 MG X 42 tablet Take 1 Package by mouth. 05/08/18   [provider]  ciprofloxacin (CIPRO) 500 MG tablet Take 1 tablet (500 mg total) by mouth every 12 (twelve) hours for 10 days. 05/27/18 06/06/18  Aemilia Dedrick, PA-C  HYDROcodone-acetaminophen (NORCO/VICODIN) 5-325 MG tablet Take 1 tablet by mouth every 6 (six) hours as needed for severe pain. 05/27/18   Uva Runkel, PA-C  metroNIDAZOLE (FLAGYL) 500 MG tablet Take 1 tablet (500 mg total) by mouth 2 (two) times daily for 10 days. 05/27/18 06/06/18  Onell Mcmath, PA-C  ondansetron (ZOFRAN) 4 MG tablet Take 1 tablet (4 mg total) by mouth every 8 (eight) hours as needed.  05/27/18   Elsa Ploch, PA-C    Family History Family History  Problem Relation Age of Onset  . Cancer Other   . Hyperlipidemia Other     Social History Social History   Tobacco Use  . Smoking status: Former Smoker    Types: Cigarettes    Last attempt to quit: 06/08/2015    Years since quitting: 2.9  . Smokeless tobacco: Never Used  Substance Use Topics  . Alcohol use: No  . Drug use: No     Allergies   Phenergan [promethazine hcl] and Amoxicillin   Review of Systems Review of Systems  Constitutional: Negative for fever.  Gastrointestinal: Positive for abdominal pain and nausea.  All other systems reviewed and are negative.    Physical Exam Updated Vital Signs BP 109/76   Pulse 96   Temp 98.5 F (36.9 C) (Oral)   Resp 16   Ht 5\' 4"  (1.626 m)    Wt 74.8 kg   LMP 05/07/2018   SpO2 99%   BMI 28.32 kg/m   Physical Exam  Constitutional: She is oriented to person, place, and time. She appears well-developed and well-nourished. No distress.  Patient appears very uncomfortable due to pain.  Otherwise in no distress  HENT:  Head: Normocephalic and atraumatic.  Eyes: Pupils are equal, round, and reactive to light. Conjunctivae and EOM are normal.  Neck: Normal range of motion. Neck supple.  Cardiovascular: Normal rate, regular rhythm and intact distal pulses.  Pulmonary/Chest: Effort normal and breath sounds normal. No respiratory distress. She has no wheezes.  Abdominal: Soft. Bowel sounds are normal. She exhibits no distension. There is tenderness in the left upper quadrant and left lower quadrant. There is CVA tenderness. There is no rigidity and no rebound.    Tenderness palpation of left lower and upper abdomen.  No tenderness palpation of suprapubic abdomen.  No tenderness palpation elsewhere in the abdomen.  Positive CVA tenderness of the left side.  No tenderness palpation elsewhere in the back.  Abdomen soft with voluntary guarding.  No rigidity.  Negative rebound no signs of peritonitis.  Musculoskeletal: Normal range of motion.  Neurological: She is alert and oriented to person, place, and time.  Skin: Skin is warm and dry. Capillary refill takes less than 2 seconds.  Psychiatric: She has a normal mood and affect.  Nursing note and vitals reviewed.    ED Treatments / Results  Labs (all labs ordered are listed, but only abnormal results are displayed) Labs Reviewed  COMPREHENSIVE METABOLIC PANEL - Abnormal; Notable for the following components:      Result Value   Glucose, Bld 121 (*)    ALT 45 (*)    All other components within normal limits  CBC WITH DIFFERENTIAL/PLATELET - Abnormal; Notable for the following components:   WBC 17.2 (*)    RBC 5.21 (*)    Hemoglobin 16.9 (*)    HCT 48.8 (*)    Neutro Abs 14.1  (*)    All other components within normal limits  URINALYSIS, ROUTINE W REFLEX MICROSCOPIC - Abnormal; Notable for the following components:   Ketones, ur 20 (*)    All other components within normal limits  LIPASE, BLOOD  I-STAT BETA HCG BLOOD, ED ([facility], [facility], [facility] ONLY)    EKG EKG Interpretation  Date/Time:  Wednesday May 27 2018 09:43:30 EDT Ventricular Rate:  104 PR Interval:    QRS Duration: 85 QT Interval:  355 QTC Calculation: 467 R Axis:   86 Text  Interpretation:  Sinus tachycardia Consider right atrial enlargement No STEMI.  Confirmed by Alona BeneLong, Joshua [phone]([id number]) on 05/27/2018 10:16:10 AM   Radiology Ct Renal Stone Study  Result Date: 05/27/2018 CLINICAL DATA:  Left lower abdominal pain since 3 a.m. EXAM: CT ABDOMEN AND PELVIS WITHOUT CONTRAST TECHNIQUE: Multidetector CT imaging of the abdomen and pelvis was performed following the standard protocol without IV contrast. COMPARISON:  None. FINDINGS: Lower chest: No acute abnormality. Hepatobiliary: No focal liver abnormality is seen. No gallstones, gallbladder wall thickening, or biliary dilatation. Pancreas: Unremarkable. No pancreatic ductal dilatation or surrounding inflammatory changes. Spleen: Normal in size without focal abnormality. Adrenals/Urinary Tract: Adrenal glands are unremarkable. Kidneys are normal, without renal calculi, focal lesion, or hydronephrosis. Bladder is unremarkable. Stomach/Bowel: Stomach is within normal limits. Appendix appears normal. No pneumatosis, pneumoperitoneum or portal venous gas. Diverticulum involving the mid descending colon with bowel wall thickening and surrounding inflammatory changes most consistent with acute diverticulitis. No peridiverticular fluid collection to suggest an abscess. Vascular/Lymphatic: No significant vascular findings are present. No enlarged abdominal or pelvic lymph nodes. Reproductive: Uterus and bilateral adnexa are unremarkable. Other: No abdominal wall hernia or  abnormality. No abdominopelvic ascites. Musculoskeletal: No aggressive osseous lesion. No acute osseous abnormality. Hemangioma in the L1 vertebral body. IMPRESSION: 1. Acute diverticulitis of the mid descending colon. No focal fluid collection to suggest an abscess. Electronically Signed   By: Elige KoHetal  Patel   On: 05/27/2018 11:16    Procedures Procedures (including critical care time)  Medications Ordered in ED Medications  fentaNYL (SUBLIMAZE) injection 50 mcg (50 mcg Intravenous Given 05/27/18 0953)  ondansetron (ZOFRAN) injection 4 mg (4 mg Intravenous Given 05/27/18 0953)  HYDROmorphone (DILAUDID) injection 1 mg (1 mg Intravenous Given 05/27/18 1019)  morphine 4 MG/ML injection 4 mg (4 mg Intravenous Given 05/27/18 1218)  HYDROmorphone (DILAUDID) injection 1 mg (1 mg Intravenous Given 05/27/18 1335)  ketorolac (TORADOL) 15 MG/ML injection 15 mg (15 mg Intravenous Given 05/27/18 1334)     Initial Impression / Assessment and Plan / ED Course  I have reviewed the triage vital signs and the nursing notes.  Pertinent labs & imaging results that were available during my care of the patient were reviewed by me and considered in my medical decision making (see chart for details).     Patient presenting for evaluation of left-sided abdominal pain.  Physical exam shows patient appears very uncomfortable, but in no acute distress.  She is afebrile not tachycardic.  As patient has acute onset of pain which is radiating to her back, concern for possible kidney stone.  Additionally considering Pilo.  Will obtain labs, urine, and CT renal for further evaluation.  Dilaudid and Zofran given for pain control.  Labs show leukocytosis at 17.2.  Otherwise reassuring.  Slightly hemoconcentrated.  CMP reassuring.  I-STAT beta negative.  Doubt GU pathology, as patient has tenderness to the left upper abdomen as well.  Doubt torsion or TOA. On reassessment, patient looks much more comfortable.  Reports improvement  of symptoms.  UA and CT pending.  UA negative for infection or blood.  CT shows diverticulitis.  No kidney stone or sign of perinephric stranding.  Discussed findings with patient.  Discussed treatment with pain control, nausea control, and antibiotics.  PMP checked, patient without narcotic prescription in the past 2 years.  Discussed follow-up with PCP as needed.  Discussed diet control.  At this time, patient appears safe for discharge.  Return precautions given.  Patient states she understands and agrees to  plan.  Final Clinical Impressions(s) / ED Diagnoses   Final diagnoses:  Diverticulitis    ED Discharge Orders         Ordered    ciprofloxacin (CIPRO) 500 MG tablet  Every 12 hours     05/27/18 1331    metroNIDAZOLE (FLAGYL) 500 MG tablet  2 times daily     05/27/18 1331    HYDROcodone-acetaminophen (NORCO/VICODIN) 5-325 MG tablet  Every 6 hours PRN     05/27/18 1331    ondansetron (ZOFRAN) 4 MG tablet  Every 8 hours PRN     05/27/18 1331           Tamaya Pun, PA-C 05/27/18 1703    Raeford RazorKohut, Stephen, MD 05/28/18 1156

## 2018-05-27 NOTE — ED Notes (Signed)
Pt returned from CT °

## 2018-05-27 NOTE — Discharge Instructions (Addendum)
Take antibiotics as prescribed.  Take the entire course, even if your symptoms improve.  Do not drink alcohol while taking this medication. Use Tylenol or ibuprofen as needed for mild to moderate pain.  Use Norco as needed for severe pain.  Do not drive while taking Norco. Use Zofran as needed for nausea or vomiting. Be careful with your diet over the next several days, keeping it simple until symptoms improve. Follow-up with your primary care doctor as needed for further evaluation. Return to the emergency room if you develop severe pain or persistent vomiting despite medication.  Return if you develop worsening symptoms after being on antibiotics for more than 24 to 48 hours, fevers, or any new or concerning symptoms.

## 2018-05-27 NOTE — ED Triage Notes (Signed)
Pt started having sharp pain in left lower abdomen at about 3 am. Wraps around slightly to back Brought in by husband

## 2018-05-27 NOTE — ED Notes (Signed)
Pt to CT

## 2018-07-05 DIAGNOSIS — Z23 Encounter for immunization: Secondary | ICD-10-CM | POA: Diagnosis not present

## 2018-07-09 DIAGNOSIS — Z1211 Encounter for screening for malignant neoplasm of colon: Secondary | ICD-10-CM | POA: Diagnosis not present

## 2018-07-09 DIAGNOSIS — Z800 Family history of malignant neoplasm of digestive organs: Secondary | ICD-10-CM | POA: Diagnosis not present

## 2018-07-09 DIAGNOSIS — K5730 Diverticulosis of large intestine without perforation or abscess without bleeding: Secondary | ICD-10-CM | POA: Diagnosis not present

## 2018-07-31 DIAGNOSIS — Z79899 Other long term (current) drug therapy: Secondary | ICD-10-CM | POA: Diagnosis not present

## 2018-08-04 DIAGNOSIS — R1032 Left lower quadrant pain: Secondary | ICD-10-CM | POA: Diagnosis not present

## 2018-08-04 DIAGNOSIS — K5732 Diverticulitis of large intestine without perforation or abscess without bleeding: Secondary | ICD-10-CM | POA: Diagnosis not present

## 2018-08-07 DIAGNOSIS — E7849 Other hyperlipidemia: Secondary | ICD-10-CM | POA: Diagnosis not present

## 2018-08-07 DIAGNOSIS — R7301 Impaired fasting glucose: Secondary | ICD-10-CM | POA: Diagnosis not present

## 2018-08-07 DIAGNOSIS — K5792 Diverticulitis of intestine, part unspecified, without perforation or abscess without bleeding: Secondary | ICD-10-CM | POA: Diagnosis not present

## 2018-08-18 DIAGNOSIS — K5730 Diverticulosis of large intestine without perforation or abscess without bleeding: Secondary | ICD-10-CM | POA: Diagnosis not present

## 2018-08-18 DIAGNOSIS — Z1211 Encounter for screening for malignant neoplasm of colon: Secondary | ICD-10-CM | POA: Diagnosis not present

## 2018-08-18 DIAGNOSIS — Z800 Family history of malignant neoplasm of digestive organs: Secondary | ICD-10-CM | POA: Diagnosis not present

## 2018-09-11 DIAGNOSIS — K6389 Other specified diseases of intestine: Secondary | ICD-10-CM | POA: Diagnosis not present

## 2018-09-11 DIAGNOSIS — Z1211 Encounter for screening for malignant neoplasm of colon: Secondary | ICD-10-CM | POA: Diagnosis not present

## 2018-09-11 DIAGNOSIS — K5730 Diverticulosis of large intestine without perforation or abscess without bleeding: Secondary | ICD-10-CM | POA: Diagnosis not present

## 2018-09-11 DIAGNOSIS — K635 Polyp of colon: Secondary | ICD-10-CM | POA: Diagnosis not present

## 2018-09-11 DIAGNOSIS — D122 Benign neoplasm of ascending colon: Secondary | ICD-10-CM | POA: Diagnosis not present

## 2018-09-11 DIAGNOSIS — Z800 Family history of malignant neoplasm of digestive organs: Secondary | ICD-10-CM | POA: Diagnosis not present

## 2018-11-06 DIAGNOSIS — Z79899 Other long term (current) drug therapy: Secondary | ICD-10-CM | POA: Diagnosis not present

## 2018-12-13 IMAGING — CT CT RENAL STONE PROTOCOL
2 of 4 series · 16 of 46 positions shown, 18 images · non-contrast
Comparison: None.

CLINICAL DATA: Left lower abdominal pain since 3 a.m..

EXAM:
CT ABDOMEN AND PELVIS WITHOUT CONTRAST
TECHNIQUE: Multidetector CT imaging of the abdomen and pelvis was performed
following the standard protocol without IV contrast.

[Series 2: axial st · axial · 0.69mm/px · z∈[-160,+215]mm · 13 of 85 slices shown, 15 images]
[im 5/85  soft-tissue]
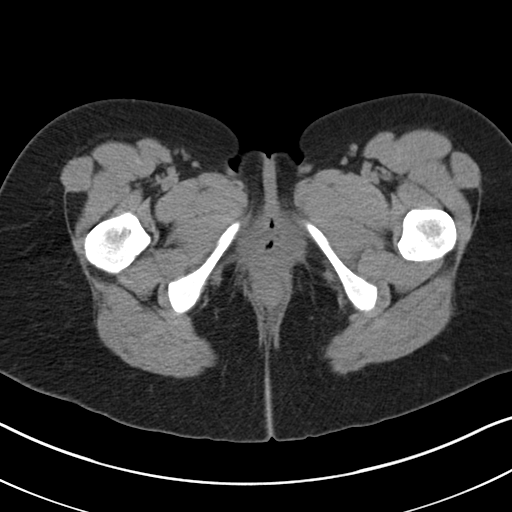
[im 5/85  bone]
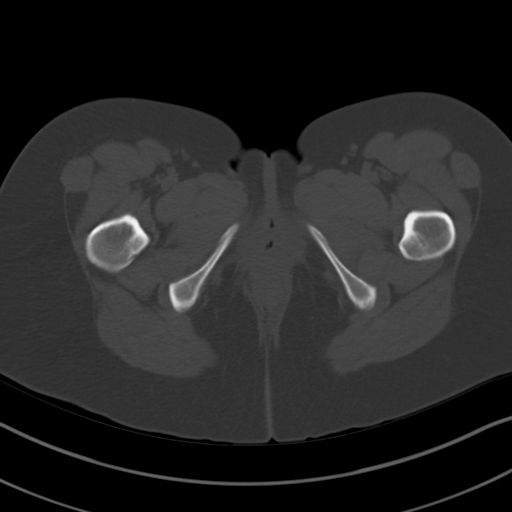
[im 14/85  soft-tissue]
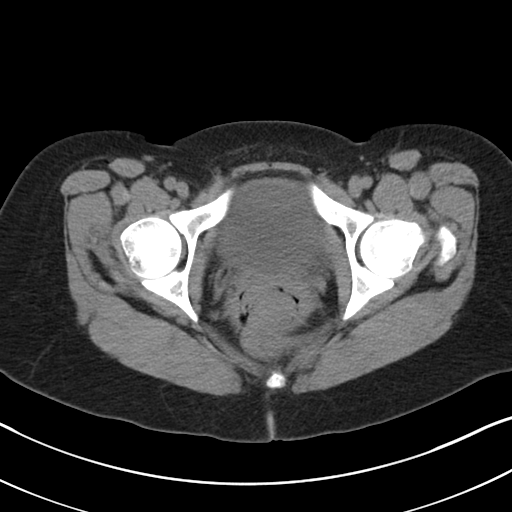
[im 18/85  soft-tissue]
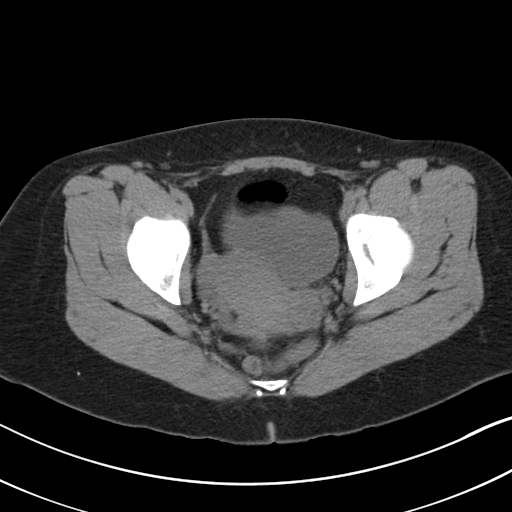
[im 23/85  soft-tissue]
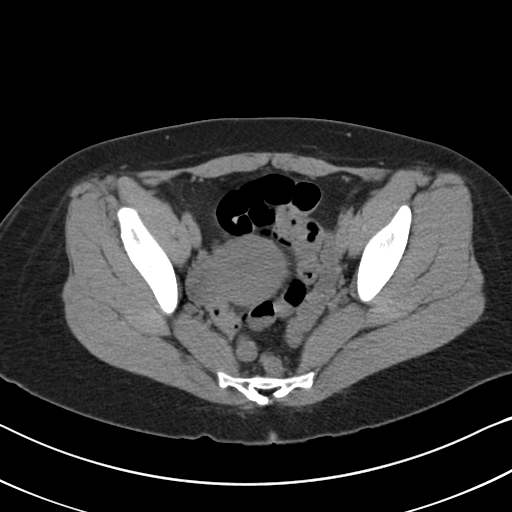
[im 31/85  soft-tissue]
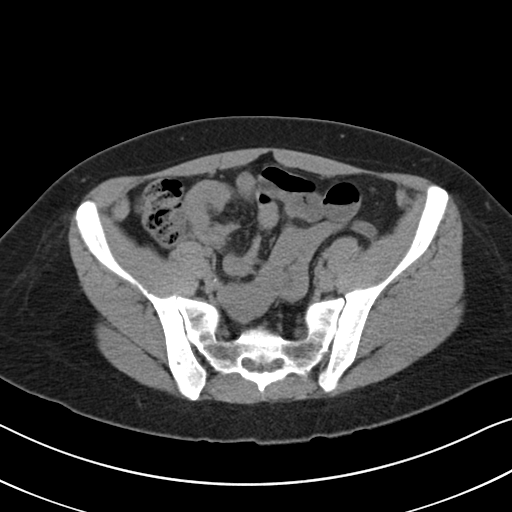
[im 36/85  soft-tissue]
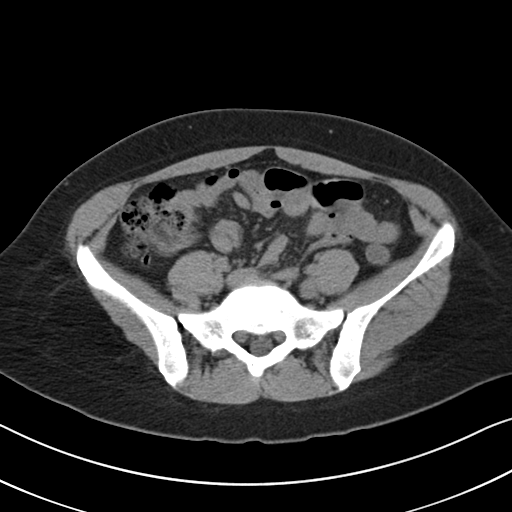
[im 45/85  soft-tissue]
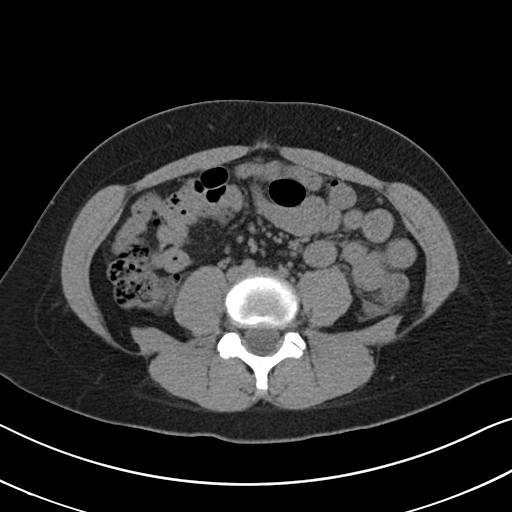
[im 49/85  soft-tissue]
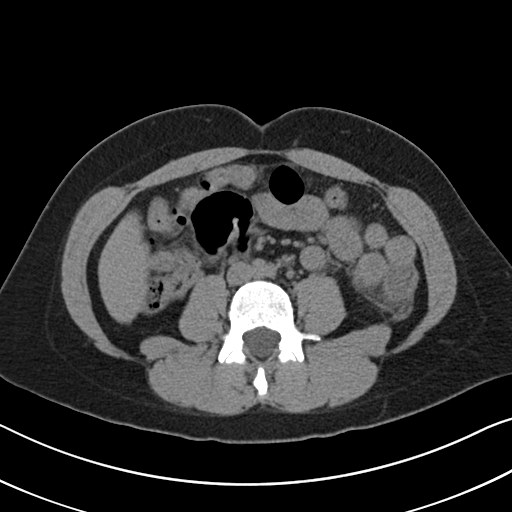
[im 54/85  soft-tissue]
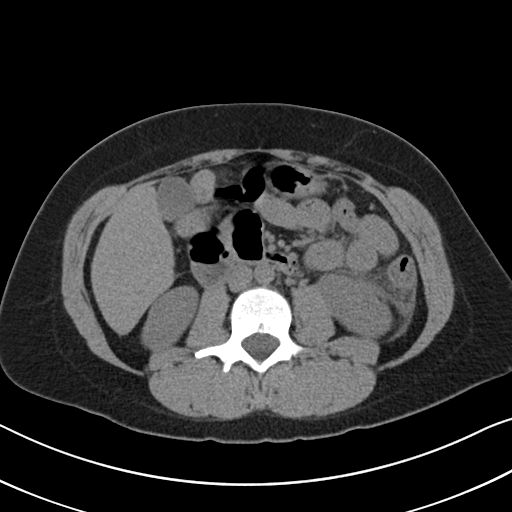
[im 54/85  bone]
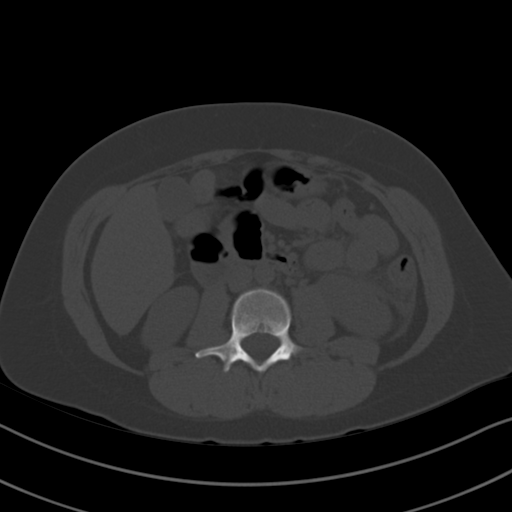
[im 62/85  soft-tissue]
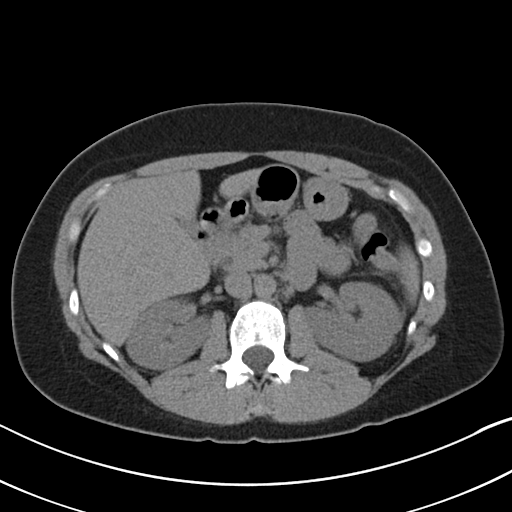
[im 67/85  soft-tissue]
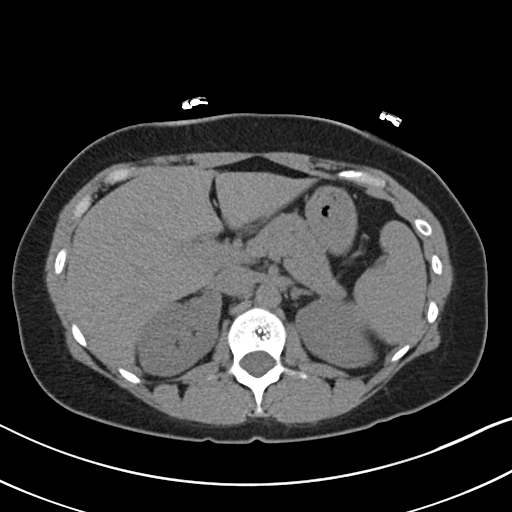
[im 71/85  soft-tissue]
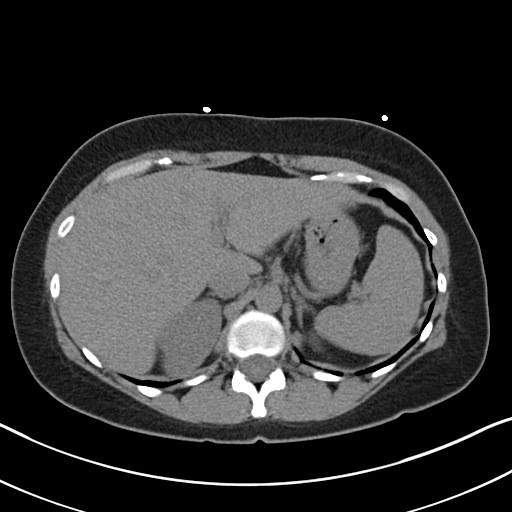
[im 80/85  soft-tissue]
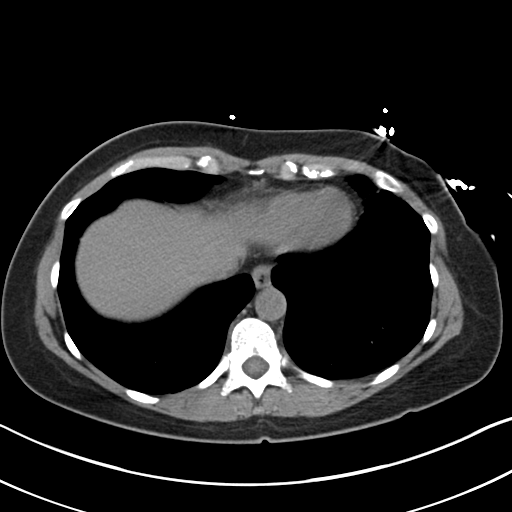

[Series 4: coronal · coronal · 0.90mm/px · 3 of 93 slices shown]
[im 31/93  soft-tissue]
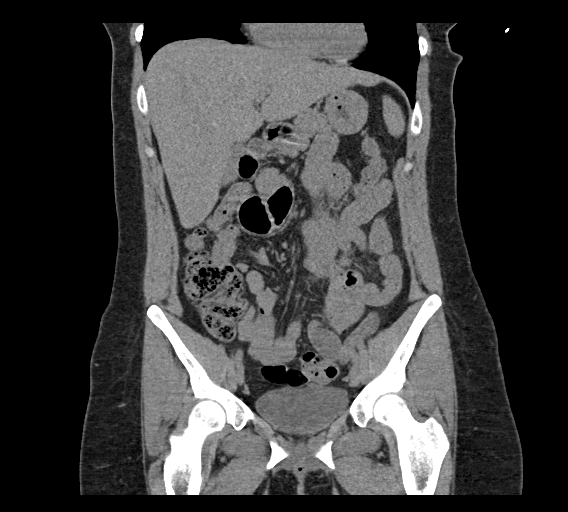
[im 41/93  soft-tissue]
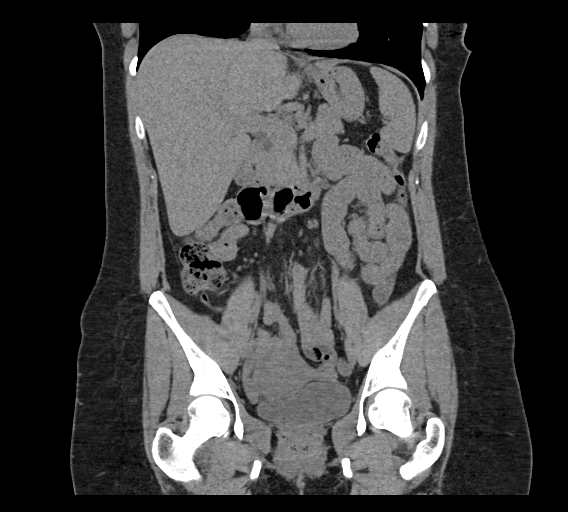
[im 52/93  soft-tissue]
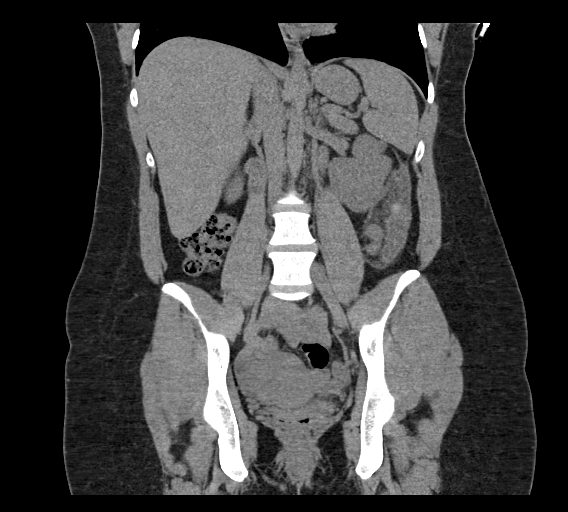

[16 of 46 positions shown; findings below may reference images not displayed]

FINDINGS: Lower chest: No acute abnormality.

Hepatobiliary: No focal liver abnormality is seen. No gallstones,
gallbladder wall thickening, or biliary dilatation.

Pancreas: Unremarkable. No pancreatic ductal dilatation or
surrounding inflammatory changes.

Spleen: Normal in size without focal abnormality.

Adrenals/Urinary Tract: Adrenal glands are unremarkable. Kidneys are
normal, without renal calculi, focal lesion, or hydronephrosis.
Bladder is unremarkable.

Stomach/Bowel: Stomach is within normal limits. Appendix appears
normal. No pneumatosis, pneumoperitoneum or portal venous gas.
Diverticulum involving the mid descending colon with bowel wall
thickening and surrounding inflammatory changes most consistent with
acute diverticulitis. No peridiverticular fluid collection to
suggest an abscess.

Vascular/Lymphatic: No significant vascular findings are present. No
enlarged abdominal or pelvic lymph nodes.

Reproductive: Uterus and bilateral adnexa are unremarkable.

Other: No abdominal wall hernia or abnormality. No abdominopelvic
ascites.

Musculoskeletal: No aggressive osseous lesion. No acute osseous
abnormality. Hemangioma in the L1 vertebral body.
IMPRESSION: 1. Acute diverticulitis of the mid descending colon. No focal fluid
collection to suggest an abscess.

## 2022-11-01 ENCOUNTER — Other Ambulatory Visit (HOSPITAL_BASED_OUTPATIENT_CLINIC_OR_DEPARTMENT_OTHER): Payer: Self-pay

## 2022-11-01 MED ORDER — LISDEXAMFETAMINE DIMESYLATE 50 MG PO CAPS
50.0000 mg | ORAL_CAPSULE | Freq: Every day | ORAL | 0 refills | Status: DC
  Filled 2022-11-01: qty 30, 30d supply, fill #0

## 2022-11-04 ENCOUNTER — Other Ambulatory Visit (HOSPITAL_BASED_OUTPATIENT_CLINIC_OR_DEPARTMENT_OTHER): Payer: Self-pay

## 2022-11-28 ENCOUNTER — Other Ambulatory Visit: Payer: Self-pay | Admitting: Obstetrics and Gynecology

## 2022-11-28 DIAGNOSIS — N6320 Unspecified lump in the left breast, unspecified quadrant: Secondary | ICD-10-CM

## 2022-12-03 ENCOUNTER — Other Ambulatory Visit (HOSPITAL_BASED_OUTPATIENT_CLINIC_OR_DEPARTMENT_OTHER): Payer: Self-pay

## 2022-12-03 MED ORDER — LISDEXAMFETAMINE DIMESYLATE 50 MG PO CAPS
50.0000 mg | ORAL_CAPSULE | Freq: Every day | ORAL | 0 refills | Status: DC
  Filled 2022-12-03: qty 30, 30d supply, fill #0

## 2022-12-04 ENCOUNTER — Other Ambulatory Visit: Payer: Self-pay

## 2022-12-05 ENCOUNTER — Other Ambulatory Visit (HOSPITAL_BASED_OUTPATIENT_CLINIC_OR_DEPARTMENT_OTHER): Payer: Self-pay

## 2022-12-16 ENCOUNTER — Ambulatory Visit
Admission: RE | Admit: 2022-12-16 | Discharge: 2022-12-16 | Disposition: A | Discharge status: Home / Self Care | Payer: 59 | Source: Ambulatory Visit | Attending: Obstetrics and Gynecology | Admitting: Obstetrics and Gynecology

## 2022-12-16 DIAGNOSIS — N6320 Unspecified lump in the left breast, unspecified quadrant: Secondary | ICD-10-CM

## 2023-01-08 ENCOUNTER — Other Ambulatory Visit (HOSPITAL_BASED_OUTPATIENT_CLINIC_OR_DEPARTMENT_OTHER): Payer: Self-pay

## 2023-01-08 MED ORDER — LISDEXAMFETAMINE DIMESYLATE 50 MG PO CAPS
50.0000 mg | ORAL_CAPSULE | Freq: Every day | ORAL | 0 refills | Status: DC
  Filled 2023-01-08: qty 30, 30d supply, fill #0

## 2023-02-10 ENCOUNTER — Other Ambulatory Visit (HOSPITAL_BASED_OUTPATIENT_CLINIC_OR_DEPARTMENT_OTHER): Payer: Self-pay

## 2023-02-10 MED ORDER — LISDEXAMFETAMINE DIMESYLATE 50 MG PO CAPS
50.0000 mg | ORAL_CAPSULE | Freq: Every day | ORAL | 0 refills | Status: DC
  Filled 2023-02-10 (×2): qty 30, 30d supply, fill #0

## 2023-02-11 ENCOUNTER — Other Ambulatory Visit (HOSPITAL_BASED_OUTPATIENT_CLINIC_OR_DEPARTMENT_OTHER): Payer: Self-pay

## 2023-03-11 ENCOUNTER — Other Ambulatory Visit (HOSPITAL_BASED_OUTPATIENT_CLINIC_OR_DEPARTMENT_OTHER): Payer: Self-pay

## 2023-03-11 MED ORDER — LISDEXAMFETAMINE DIMESYLATE 50 MG PO CAPS
50.0000 mg | ORAL_CAPSULE | Freq: Every day | ORAL | 0 refills | Status: DC
  Filled 2023-03-11: qty 30, 30d supply, fill #0

## 2023-03-12 ENCOUNTER — Other Ambulatory Visit: Payer: Self-pay

## 2023-04-16 ENCOUNTER — Other Ambulatory Visit (HOSPITAL_BASED_OUTPATIENT_CLINIC_OR_DEPARTMENT_OTHER): Payer: Self-pay

## 2023-04-16 MED ORDER — LISDEXAMFETAMINE DIMESYLATE 50 MG PO CAPS
50.0000 mg | ORAL_CAPSULE | Freq: Every day | ORAL | 0 refills | Status: DC
  Filled 2023-04-16: qty 30, 30d supply, fill #0

## 2023-04-17 ENCOUNTER — Other Ambulatory Visit: Payer: Self-pay

## 2023-05-19 ENCOUNTER — Other Ambulatory Visit (HOSPITAL_BASED_OUTPATIENT_CLINIC_OR_DEPARTMENT_OTHER): Payer: Self-pay

## 2023-05-19 MED ORDER — LISDEXAMFETAMINE DIMESYLATE 50 MG PO CAPS
50.0000 mg | ORAL_CAPSULE | Freq: Every day | ORAL | 0 refills | Status: DC
  Filled 2023-05-19: qty 30, 30d supply, fill #0

## 2023-06-18 ENCOUNTER — Other Ambulatory Visit (HOSPITAL_BASED_OUTPATIENT_CLINIC_OR_DEPARTMENT_OTHER): Payer: Self-pay

## 2023-06-18 MED ORDER — LISDEXAMFETAMINE DIMESYLATE 50 MG PO CAPS
50.0000 mg | ORAL_CAPSULE | Freq: Every day | ORAL | 0 refills | Status: DC
  Filled 2023-06-18: qty 30, 30d supply, fill #0

## 2023-07-15 ENCOUNTER — Other Ambulatory Visit (HOSPITAL_BASED_OUTPATIENT_CLINIC_OR_DEPARTMENT_OTHER): Payer: Self-pay

## 2023-07-15 MED ORDER — LISDEXAMFETAMINE DIMESYLATE 50 MG PO CAPS
50.0000 mg | ORAL_CAPSULE | Freq: Every day | ORAL | 0 refills | Status: DC
  Filled 2023-07-15: qty 30, 30d supply, fill #0

## 2023-07-16 ENCOUNTER — Other Ambulatory Visit (HOSPITAL_BASED_OUTPATIENT_CLINIC_OR_DEPARTMENT_OTHER): Payer: Self-pay

## 2023-07-17 ENCOUNTER — Other Ambulatory Visit: Payer: Self-pay

## 2023-08-15 ENCOUNTER — Other Ambulatory Visit (HOSPITAL_BASED_OUTPATIENT_CLINIC_OR_DEPARTMENT_OTHER): Payer: Self-pay

## 2023-08-15 MED ORDER — LISDEXAMFETAMINE DIMESYLATE 50 MG PO CAPS
50.0000 mg | ORAL_CAPSULE | Freq: Every day | ORAL | 0 refills | Status: DC
  Filled 2023-08-15: qty 30, 30d supply, fill #0

## 2023-09-18 ENCOUNTER — Other Ambulatory Visit (HOSPITAL_BASED_OUTPATIENT_CLINIC_OR_DEPARTMENT_OTHER): Payer: Self-pay

## 2023-09-18 MED ORDER — LISDEXAMFETAMINE DIMESYLATE 50 MG PO CAPS
50.0000 mg | ORAL_CAPSULE | Freq: Every day | ORAL | 0 refills | Status: DC
  Filled 2023-09-18: qty 30, 30d supply, fill #0

## 2023-10-21 ENCOUNTER — Other Ambulatory Visit (HOSPITAL_BASED_OUTPATIENT_CLINIC_OR_DEPARTMENT_OTHER): Payer: Self-pay

## 2023-10-21 MED ORDER — LISDEXAMFETAMINE DIMESYLATE 50 MG PO CAPS
50.0000 mg | ORAL_CAPSULE | Freq: Every day | ORAL | 0 refills | Status: DC
  Filled 2023-10-21: qty 30, 30d supply, fill #0

## 2023-10-24 ENCOUNTER — Other Ambulatory Visit (HOSPITAL_BASED_OUTPATIENT_CLINIC_OR_DEPARTMENT_OTHER): Payer: Self-pay

## 2023-11-21 ENCOUNTER — Other Ambulatory Visit (HOSPITAL_BASED_OUTPATIENT_CLINIC_OR_DEPARTMENT_OTHER): Payer: Self-pay

## 2023-11-21 MED ORDER — LISDEXAMFETAMINE DIMESYLATE 50 MG PO CAPS
50.0000 mg | ORAL_CAPSULE | Freq: Every day | ORAL | 0 refills | Status: DC
  Filled 2023-11-21: qty 30, 30d supply, fill #0

## 2023-12-03 ENCOUNTER — Other Ambulatory Visit: Payer: Self-pay | Admitting: Obstetrics and Gynecology

## 2023-12-03 DIAGNOSIS — Z1231 Encounter for screening mammogram for malignant neoplasm of breast: Secondary | ICD-10-CM

## 2023-12-22 ENCOUNTER — Other Ambulatory Visit (HOSPITAL_BASED_OUTPATIENT_CLINIC_OR_DEPARTMENT_OTHER): Payer: Self-pay

## 2023-12-22 MED ORDER — LISDEXAMFETAMINE DIMESYLATE 50 MG PO CAPS
50.0000 mg | ORAL_CAPSULE | Freq: Every day | ORAL | 0 refills | Status: DC
  Filled 2023-12-22: qty 30, 30d supply, fill #0

## 2023-12-24 ENCOUNTER — Ambulatory Visit
Admission: RE | Admit: 2023-12-24 | Discharge: 2023-12-24 | Disposition: A | Discharge status: Home / Self Care | Payer: 59 | Source: Ambulatory Visit | Attending: Obstetrics and Gynecology

## 2023-12-24 DIAGNOSIS — Z1231 Encounter for screening mammogram for malignant neoplasm of breast: Secondary | ICD-10-CM

## 2024-01-24 ENCOUNTER — Other Ambulatory Visit (HOSPITAL_BASED_OUTPATIENT_CLINIC_OR_DEPARTMENT_OTHER): Payer: Self-pay

## 2024-01-26 ENCOUNTER — Other Ambulatory Visit (HOSPITAL_BASED_OUTPATIENT_CLINIC_OR_DEPARTMENT_OTHER): Payer: Self-pay

## 2024-01-26 ENCOUNTER — Other Ambulatory Visit: Payer: Self-pay

## 2024-01-26 MED ORDER — LISDEXAMFETAMINE DIMESYLATE 50 MG PO CAPS
50.0000 mg | ORAL_CAPSULE | Freq: Every day | ORAL | 0 refills | Status: DC
  Filled 2024-01-26 (×2): qty 30, 30d supply, fill #0

## 2024-01-27 ENCOUNTER — Other Ambulatory Visit (HOSPITAL_BASED_OUTPATIENT_CLINIC_OR_DEPARTMENT_OTHER): Payer: Self-pay

## 2024-02-26 ENCOUNTER — Other Ambulatory Visit (HOSPITAL_BASED_OUTPATIENT_CLINIC_OR_DEPARTMENT_OTHER): Payer: Self-pay

## 2024-02-26 MED ORDER — LISDEXAMFETAMINE DIMESYLATE 50 MG PO CAPS
50.0000 mg | ORAL_CAPSULE | Freq: Every day | ORAL | 0 refills | Status: DC
  Filled 2024-02-26: qty 30, 30d supply, fill #0

## 2024-03-29 ENCOUNTER — Other Ambulatory Visit (HOSPITAL_BASED_OUTPATIENT_CLINIC_OR_DEPARTMENT_OTHER): Payer: Self-pay

## 2024-03-29 MED ORDER — LISDEXAMFETAMINE DIMESYLATE 50 MG PO CAPS
50.0000 mg | ORAL_CAPSULE | Freq: Every day | ORAL | 0 refills | Status: DC
  Filled 2024-03-29: qty 30, 30d supply, fill #0

## 2024-04-26 ENCOUNTER — Other Ambulatory Visit (HOSPITAL_BASED_OUTPATIENT_CLINIC_OR_DEPARTMENT_OTHER): Payer: Self-pay

## 2024-04-26 MED ORDER — LISDEXAMFETAMINE DIMESYLATE 50 MG PO CAPS
50.0000 mg | ORAL_CAPSULE | Freq: Every day | ORAL | 0 refills | Status: DC
  Filled 2024-04-28: qty 30, 30d supply, fill #0

## 2024-04-28 ENCOUNTER — Other Ambulatory Visit (HOSPITAL_BASED_OUTPATIENT_CLINIC_OR_DEPARTMENT_OTHER): Payer: Self-pay

## 2024-05-28 ENCOUNTER — Other Ambulatory Visit (HOSPITAL_BASED_OUTPATIENT_CLINIC_OR_DEPARTMENT_OTHER): Payer: Self-pay

## 2024-05-28 MED ORDER — LISDEXAMFETAMINE DIMESYLATE 50 MG PO CAPS
50.0000 mg | ORAL_CAPSULE | Freq: Every day | ORAL | 0 refills | Status: DC
  Filled 2024-05-28: qty 30, 30d supply, fill #0

## 2024-05-31 ENCOUNTER — Other Ambulatory Visit (HOSPITAL_BASED_OUTPATIENT_CLINIC_OR_DEPARTMENT_OTHER): Payer: Self-pay

## 2024-06-01 ENCOUNTER — Other Ambulatory Visit (HOSPITAL_BASED_OUTPATIENT_CLINIC_OR_DEPARTMENT_OTHER): Payer: Self-pay

## 2024-07-01 ENCOUNTER — Other Ambulatory Visit (HOSPITAL_BASED_OUTPATIENT_CLINIC_OR_DEPARTMENT_OTHER): Payer: Self-pay

## 2024-07-01 MED ORDER — LISDEXAMFETAMINE DIMESYLATE 50 MG PO CAPS
50.0000 mg | ORAL_CAPSULE | Freq: Every day | ORAL | 0 refills | Status: DC
  Filled 2024-07-01: qty 30, 30d supply, fill #0

## 2024-07-02 ENCOUNTER — Other Ambulatory Visit (HOSPITAL_BASED_OUTPATIENT_CLINIC_OR_DEPARTMENT_OTHER): Payer: Self-pay

## 2024-08-03 ENCOUNTER — Other Ambulatory Visit (HOSPITAL_BASED_OUTPATIENT_CLINIC_OR_DEPARTMENT_OTHER): Payer: Self-pay

## 2024-08-03 MED ORDER — LISDEXAMFETAMINE DIMESYLATE 50 MG PO CAPS
50.0000 mg | ORAL_CAPSULE | Freq: Every day | ORAL | 0 refills | Status: DC
  Filled 2024-08-03: qty 30, 30d supply, fill #0

## 2024-08-30 ENCOUNTER — Other Ambulatory Visit (HOSPITAL_BASED_OUTPATIENT_CLINIC_OR_DEPARTMENT_OTHER): Payer: Self-pay

## 2024-08-30 MED ORDER — LISDEXAMFETAMINE DIMESYLATE 50 MG PO CAPS
50.0000 mg | ORAL_CAPSULE | Freq: Every day | ORAL | 0 refills | Status: DC
  Filled 2024-08-30 – 2024-09-01 (×2): qty 30, 30d supply, fill #0

## 2024-09-01 ENCOUNTER — Other Ambulatory Visit: Payer: Self-pay

## 2024-09-01 ENCOUNTER — Other Ambulatory Visit (HOSPITAL_BASED_OUTPATIENT_CLINIC_OR_DEPARTMENT_OTHER): Payer: Self-pay

## 2024-10-04 ENCOUNTER — Other Ambulatory Visit (HOSPITAL_BASED_OUTPATIENT_CLINIC_OR_DEPARTMENT_OTHER): Payer: Self-pay

## 2024-10-04 MED ORDER — LISDEXAMFETAMINE DIMESYLATE 50 MG PO CAPS
50.0000 mg | ORAL_CAPSULE | Freq: Every day | ORAL | 0 refills | Status: DC
  Filled 2024-10-04: qty 30, 30d supply, fill #0

## 2024-11-03 ENCOUNTER — Other Ambulatory Visit (HOSPITAL_BASED_OUTPATIENT_CLINIC_OR_DEPARTMENT_OTHER): Payer: Self-pay

## 2024-11-03 MED ORDER — LISDEXAMFETAMINE DIMESYLATE 50 MG PO CAPS
50.0000 mg | ORAL_CAPSULE | Freq: Every day | ORAL | 0 refills | Status: AC
  Filled 2024-11-03: qty 30, 30d supply, fill #0
